# Patient Record
Sex: Male | Born: 1960 | Race: White | Hispanic: No | Smoking: Current every day smoker
Health system: Southern US, Community
[De-identification: ages and names within clinical notes are randomized; demographics above are authoritative.]

## PROBLEM LIST (undated history)

## (undated) DIAGNOSIS — K759 Inflammatory liver disease, unspecified: Secondary | ICD-10-CM

## (undated) HISTORY — PX: TONSILLECTOMY: SUR1361

---

## 2015-07-08 DIAGNOSIS — F17200 Nicotine dependence, unspecified, uncomplicated: Secondary | ICD-10-CM | POA: Insufficient documentation

## 2015-07-08 DIAGNOSIS — Z87891 Personal history of nicotine dependence: Secondary | ICD-10-CM | POA: Insufficient documentation

## 2015-07-08 DIAGNOSIS — N529 Male erectile dysfunction, unspecified: Secondary | ICD-10-CM | POA: Insufficient documentation

## 2015-07-08 DIAGNOSIS — F419 Anxiety disorder, unspecified: Secondary | ICD-10-CM | POA: Insufficient documentation

## 2015-07-08 DIAGNOSIS — F1011 Alcohol abuse, in remission: Secondary | ICD-10-CM | POA: Insufficient documentation

## 2016-12-14 DIAGNOSIS — J3089 Other allergic rhinitis: Secondary | ICD-10-CM | POA: Insufficient documentation

## 2017-02-28 DIAGNOSIS — M5116 Intervertebral disc disorders with radiculopathy, lumbar region: Secondary | ICD-10-CM | POA: Insufficient documentation

## 2017-03-26 DIAGNOSIS — M25552 Pain in left hip: Secondary | ICD-10-CM | POA: Insufficient documentation

## 2017-11-07 DIAGNOSIS — Z122 Encounter for screening for malignant neoplasm of respiratory organs: Secondary | ICD-10-CM | POA: Insufficient documentation

## 2018-03-02 HISTORY — PX: MOHS SURGERY: SUR867

## 2018-05-09 DIAGNOSIS — J432 Centrilobular emphysema: Secondary | ICD-10-CM | POA: Insufficient documentation

## 2018-08-28 ENCOUNTER — Other Ambulatory Visit: Payer: Self-pay | Admitting: Orthopaedic Surgery

## 2018-09-18 NOTE — Patient Instructions (Addendum)
DUE TO COVID-19 ONLY ONE VISITOR IS ALLOWED TO COME WITH YOU AND STAY IN THE WAITING ROOM ONLY DURING PRE OP AND PROCEDURE DAY OF SURGERY. THE 1 VISITOR MAY VISIT WITH YOU AFTER SURGERY IN YOUR PRIVATE ROOM DURING VISITING HOURS ONLY!  YOU NEED TO HAVE A COVID 19 TEST ON 09-26-2018 @ 2:45 PM, THIS TEST MUST BE DONE BEFORE SURGERY, COME  [address], [place] , [id number].  [facility] ONCE YOUR COVID TEST IS COMPLETED, PLEASE BEGIN THE QUARANTINE INSTRUCTIONS AS OUTLINED IN YOUR HANDOUT.                Jonathan Stephens    Your procedure is scheduled on: 09-30-2018   [facility]    Report to Admitting at 7:30AM     Call this number if you have problems the morning of surgery [phone]   BRUSH YOUR TEETH MORNING OF SURGERY AND RINSE YOUR MOUTH OUT, NO CHEWING GUM CANDY OR MINTS.   Do not eat food After Midnight. YOU MAY HAVE CLEAR LIQUIDS FROM MIDNIGHT UNTIL 7:00AM. At 7:00AM Please finish the prescribed Pre-Surgery ENSURE drink. Nothing by mouth after you finish the ENSURE drink !   CLEAR LIQUID DIET   Foods Allowed                                                                     Foods Excluded  Coffee and tea, regular and decaf                             liquids that you cannot  Plain Jell-O any favor except red or purple                                           see through such as: Fruit ices (not with fruit pulp)                                     milk, soups, orange juice  Iced Popsicles                                    All solid food Carbonated beverages, regular and diet                                    Cranberry, grape and apple juices Sports drinks like Gatorade Lightly seasoned clear broth or consume(fat free) Sugar, honey syrup   _____________________________________________________________________     Take these medicines the morning of surgery with A SIP OF WATER: GABAPENTIN                                You may not have any metal on your body including hair pins and  piercings     Do not wear jewelry, cologne, lotions, powders or deodorant                          Men may shave face and neck.   Do not bring valuables to the hospital. [place].  Contacts, dentures or bridgework may not be worn into surgery.   YOU MAY BRING A SMALL OVERNIGHT BAG              Please read over the following fact sheets you were given: _____________________________________________________________________             [facility] - Preparing for Surgery Before surgery, you can play an important role.  Because skin is not sterile, your skin needs to be as free of germs as possible.  You can reduce the number of germs on your skin by washing with CHG (chlorahexidine gluconate) soap before surgery.  CHG is an antiseptic cleaner which kills germs and bonds with the skin to continue killing germs even after washing. Please DO NOT use if you have an allergy to CHG or antibacterial soaps.  If your skin becomes reddened/irritated stop using the CHG and inform your nurse when you arrive at Short Stay. Do not shave (including legs and underarms) for at least 48 hours prior to the first CHG shower.  You may shave your face/neck. Please follow these instructions carefully:  1.  Shower with CHG Soap the night before surgery and the  morning of Surgery.  2.  If you choose to wash your hair, wash your hair first as usual with your  normal  shampoo.  3.  After you shampoo, rinse your hair and body thoroughly to remove the  shampoo.                           4.  Use CHG as you would any other liquid soap.  You can apply chg directly  to the skin and wash                       Gently with a scrungie or clean washcloth.  5.  Apply the CHG Soap to your body ONLY FROM THE NECK DOWN.   Do not use on face/ open                           Wound or open sores.  Avoid contact with eyes, ears mouth and genitals (private parts).                       Wash face,  Genitals (private parts) with your normal soap.             6.  Wash thoroughly, paying special attention to the area where your surgery  will be performed.  7.  Thoroughly rinse your body with warm water from the neck down.  8.  DO NOT shower/wash with your normal soap after using and rinsing off  the CHG Soap.                9.  Pat yourself dry with a clean towel.            10.  Wear clean pajamas.  11.  Place clean sheets on your bed the night of your first shower and do not  sleep with pets. Day of Surgery : Do not apply any lotions/deodorants the morning of surgery.  Please wear clean clothes to the hospital/surgery center.  FAILURE TO FOLLOW THESE INSTRUCTIONS MAY RESULT IN THE CANCELLATION OF YOUR SURGERY PATIENT SIGNATURE_________________________________  NURSE SIGNATURE__________________________________  ________________________________________________________________________   Jonathan Stephens  An incentive spirometer is a tool that can help keep your lungs clear and active. This tool measures how well you are filling your lungs with each breath. Taking long deep breaths may help reverse or decrease the chance of developing breathing (pulmonary) problems (especially infection) following:  A long period of time when you are unable to move or be active. BEFORE THE PROCEDURE   If the spirometer includes an indicator to show your best effort, your nurse or respiratory therapist will set it to a desired goal.  If possible, sit up straight or lean slightly forward. Try not to slouch.  Hold the incentive spirometer in an upright position. INSTRUCTIONS FOR USE  1. Sit on the edge of your bed if possible, or sit up as far as you can in bed or on a chair. 2. Hold the incentive spirometer in an upright position. 3. Breathe out normally. 4. Place the mouthpiece in your  mouth and seal your lips tightly around it. 5. Breathe in slowly and as deeply as possible, raising the piston or the ball toward the top of the column. 6. Hold your breath for 3-5 seconds or for as long as possible. Allow the piston or ball to fall to the bottom of the column. 7. Remove the mouthpiece from your mouth and breathe out normally. 8. Rest for a few seconds and repeat Steps 1 through 7 at least 10 times every 1-2 hours when you are awake. Take your time and take a few normal breaths between deep breaths. 9. The spirometer may include an indicator to show your best effort. Use the indicator as a goal to work toward during each repetition. 10. After each set of 10 deep breaths, practice coughing to be sure your lungs are clear. If you have an incision (the cut made at the time of surgery), support your incision when coughing by placing a pillow or rolled up towels firmly against it. Once you are able to get out of bed, walk around indoors and cough well. You may stop using the incentive spirometer when instructed by your caregiver.  RISKS AND COMPLICATIONS  Take your time so you do not get dizzy or light-headed.  If you are in pain, you may need to take or ask for pain medication before doing incentive spirometry. It is harder to take a deep breath if you are having pain. AFTER USE  Rest and breathe slowly and easily.  It can be helpful to keep track of a log of your progress. Your caregiver can provide you with a simple table to help with this. If you are using the spirometer at home, follow these instructions: Jonathan Stephens IF:   You are having difficultly using the spirometer.  You have trouble using the spirometer as often as instructed.  Your pain medication is not giving enough relief while using the spirometer.  You develop fever of 100.5 F (38.1 C) or higher. SEEK IMMEDIATE MEDICAL CARE IF:   You cough up bloody sputum that had not been present before.  You  develop fever of 102 F (38.9 C)  or greater.  You develop worsening pain at or near the incision site. MAKE SURE YOU:   Understand these instructions.  Will watch your condition.  Will get help right away if you are not doing well or get worse. Document Released: 04/30/2006 Document Revised: 03/12/2011 Document Reviewed: 07/01/2006 [facility] Patient Information 2014 [place], [place].   ________________________________________________________________________

## 2018-09-22 ENCOUNTER — Encounter (HOSPITAL_COMMUNITY)
Admission: RE | Admit: 2018-09-22 | Discharge: 2018-09-22 | Disposition: A | Discharge status: Home / Self Care | Payer: Federal, State, Local not specified - PPO | Source: Ambulatory Visit | Attending: Orthopaedic Surgery | Admitting: Orthopaedic Surgery

## 2018-09-22 ENCOUNTER — Encounter (HOSPITAL_COMMUNITY): Payer: Self-pay

## 2018-09-22 ENCOUNTER — Ambulatory Visit (HOSPITAL_COMMUNITY)
Admission: RE | Admit: 2018-09-22 | Discharge: 2018-09-22 | Disposition: A | Discharge status: Home / Self Care | Payer: Federal, State, Local not specified - PPO | Source: Ambulatory Visit | Attending: Orthopaedic Surgery | Admitting: Orthopaedic Surgery

## 2018-09-22 ENCOUNTER — Other Ambulatory Visit: Payer: Self-pay

## 2018-09-22 DIAGNOSIS — Z01818 Encounter for other preprocedural examination: Secondary | ICD-10-CM

## 2018-09-22 LAB — URINALYSIS, ROUTINE W REFLEX MICROSCOPIC
Bacteria, UA: NONE SEEN
Bilirubin Urine: NEGATIVE
Glucose, UA: NEGATIVE mg/dL
Hgb urine dipstick: NEGATIVE
Ketones, ur: NEGATIVE mg/dL
Leukocytes,Ua: NEGATIVE
Nitrite: NEGATIVE
Protein, ur: 30 mg/dL — AB
Specific Gravity, Urine: 1.02 (ref 1.005–1.030)
pH: 5 (ref 5.0–8.0)

## 2018-09-22 LAB — CBC WITH DIFFERENTIAL/PLATELET
Abs Immature Granulocytes: 0.04 10*3/uL (ref 0.00–0.07)
Basophils Absolute: 0.1 10*3/uL (ref 0.0–0.1)
Basophils Relative: 1 %
Eosinophils Absolute: 0.2 10*3/uL (ref 0.0–0.5)
Eosinophils Relative: 2 %
HCT: 38.3 % — ABNORMAL LOW (ref 39.0–52.0)
Hemoglobin: 13.3 g/dL (ref 13.0–17.0)
Immature Granulocytes: 1 %
Lymphocytes Relative: 31 %
Lymphs Abs: 2.6 10*3/uL (ref 0.7–4.0)
MCH: 29.2 pg (ref 26.0–34.0)
MCHC: 34.7 g/dL (ref 30.0–36.0)
MCV: 84.2 fL (ref 80.0–100.0)
Monocytes Absolute: 0.5 10*3/uL (ref 0.1–1.0)
Monocytes Relative: 6 %
Neutro Abs: 5 10*3/uL (ref 1.7–7.7)
Neutrophils Relative %: 59 %
Platelets: 230 10*3/uL (ref 150–400)
RBC: 4.55 MIL/uL (ref 4.22–5.81)
RDW: 12.7 % (ref 11.5–15.5)
WBC: 8.4 10*3/uL (ref 4.0–10.5)
nRBC: 0 % (ref 0.0–0.2)

## 2018-09-22 LAB — BASIC METABOLIC PANEL
Anion gap: 8 (ref 5–15)
BUN: 15 mg/dL (ref 6–20)
CO2: 23 mmol/L (ref 22–32)
Calcium: 9.5 mg/dL (ref 8.9–10.3)
Chloride: 110 mmol/L (ref 98–111)
Creatinine, Ser: 0.75 mg/dL (ref 0.61–1.24)
GFR calc Af Amer: >60 mL/min (ref >60)
GFR calc non Af Amer: >60 mL/min (ref >60)
Glucose, Bld: 101 mg/dL — ABNORMAL HIGH (ref 70–99)
Potassium: 3.9 mmol/L (ref 3.5–5.1)
Sodium: 141 mmol/L (ref 135–145)

## 2018-09-22 LAB — PROTIME-INR
INR: 1.1 (ref 0.8–1.2)
Prothrombin Time: 14 seconds (ref 11.4–15.2)

## 2018-09-22 LAB — SURGICAL PCR SCREEN
MRSA, PCR: NEGATIVE
Staphylococcus aureus: NEGATIVE

## 2018-09-22 LAB — APTT: aPTT: 31 seconds (ref 24–36)

## 2018-09-22 NOTE — Progress Notes (Signed)
PCP - Barbaraann Boys Cardiologist -   Chest x-ray - 09-22-18 EKG -  Stress Test -  ECHO -  Cardiac Cath -   Sleep Study -  CPAP -   Fasting Blood Sugar -  Checks Blood Sugar _____ times a day  Blood Thinner Instructions: Aspirin Instructions: Last Dose:  Anesthesia review:   Patient denies shortness of breath, fever, cough and chest pain at PAT appointment   Patient verbalized understanding of instructions that were given to them at the PAT appointment. Patient was also instructed that they will need to review over the PAT instructions again at home before surgery.

## 2018-09-23 LAB — ABO/RH: ABO/RH(D): O POS

## 2018-09-26 ENCOUNTER — Other Ambulatory Visit (HOSPITAL_COMMUNITY)
Admission: RE | Admit: 2018-09-26 | Discharge: 2018-09-26 | Disposition: A | Discharge status: Home / Self Care | Payer: Federal, State, Local not specified - PPO | Source: Ambulatory Visit | Attending: Orthopaedic Surgery | Admitting: Orthopaedic Surgery

## 2018-09-26 DIAGNOSIS — Z20828 Contact with and (suspected) exposure to other viral communicable diseases: Secondary | ICD-10-CM | POA: Insufficient documentation

## 2018-09-26 DIAGNOSIS — Z01812 Encounter for preprocedural laboratory examination: Secondary | ICD-10-CM | POA: Insufficient documentation

## 2018-09-27 LAB — NOVEL CORONAVIRUS, NAA (HOSP ORDER, SEND-OUT TO REF LAB; TAT 18-24 HRS): SARS-CoV-2, NAA: NOT DETECTED

## 2018-09-29 MED ORDER — BUPIVACAINE LIPOSOME 1.3 % IJ SUSP
10.0000 mL | Freq: Once | INTRAMUSCULAR | Status: DC
  Filled 2018-09-29: qty 10

## 2018-09-29 MED ORDER — TRANEXAMIC ACID 1000 MG/10ML IV SOLN
2000.0000 mg | INTRAVENOUS | Status: DC
  Filled 2018-09-29: qty 20

## 2018-09-29 NOTE — H&P (Signed)
TOTAL HIP ADMISSION H&P  Patient is admitted for left total hip arthroplasty.  Subjective:  Chief Complaint: left hip pain  HPI: Jonathan Stephens, 58 y.o. male, has a history of pain and functional disability in the left hip(s) due to arthritis and patient has failed non-surgical conservative treatments for greater than 12 weeks to include NSAID's and/or analgesics, corticosteriod injections, flexibility and strengthening excercises, use of assistive devices, weight reduction as appropriate and activity modification.  Onset of symptoms was gradual starting 5 years ago with gradually worsening course since that time.The patient noted no past surgery on the left hip(s).  Patient currently rates pain in the left hip at 10 out of 10 with activity. Patient has night pain, worsening of pain with activity and weight bearing, trendelenberg gait, pain that interfers with activities of daily living and crepitus. Patient has evidence of subchondral cysts, subchondral sclerosis, periarticular osteophytes and joint space narrowing by imaging studies. This condition presents safety issues increasing the risk of falls. There is no current active infection.  There are no active problems to display for this patient.  No past medical history on file.  Past Surgical History:  Procedure Laterality Date  . MOHS SURGERY  03/2018    Current Facility-Administered Medications  Medication Dose Route Frequency Provider Last Rate Last Dose  . [START ON 09/30/2018] bupivacaine liposome (EXPAREL) 1.3 % injection 133 mg  10 mL Other Once Marcene Corning, MD      . Melene Muller ON 09/30/2018] tranexamic acid (CYKLOKAPRON) 2,000 mg in sodium chloride 0.9 % 50 mL Topical Application  2,000 mg Topical To OR Marcene Corning, MD       Current Outpatient Medications  Medication Sig Dispense Refill Last Dose  . cyclobenzaprine (FLEXERIL) 10 MG tablet Take 10 mg by mouth 3 (three) times daily as needed for muscle spasms.     Marland Kitchen gabapentin  (NEURONTIN) 300 MG capsule Take 300 mg by mouth 3 (three) times daily.     Marland Kitchen ibuprofen (ADVIL) 800 MG tablet Take 800 mg by mouth every 8 (eight) hours as needed (pain).     . Misc Natural Products (GLUCOSAMINE CHOND COMPLEX/MSM PO) Take 1 tablet by mouth daily.     . Multiple Vitamins-Minerals (MULTIVITAMIN WITH MINERALS) tablet Take 1 tablet by mouth daily.     . Omega-3 Fatty Acids (FISH OIL) 1000 MG CAPS Take 1,000 mg by mouth daily.      No Known Allergies  Social History   Tobacco Use  . Smoking status: Current Some Day Smoker    Types: Cigarettes  . Smokeless tobacco: Never Used  . Tobacco comment: 4-5 cigarettes a week  Substance Use Topics  . Alcohol use: Not Currently    No family history on file.   Review of Systems  Musculoskeletal: Positive for joint pain.       Left hip  All other systems reviewed and are negative.   Objective:  Physical Exam  Constitutional: He is oriented to person, place, and time. He appears well-developed and well-nourished.  HENT:  Head: Normocephalic and atraumatic.  Eyes: Pupils are equal, round, and reactive to light.  Neck: Normal range of motion.  Cardiovascular: Normal rate and regular rhythm.  Respiratory: Effort normal.  GI: Soft.  Musculoskeletal:     Comments: Left hip motion is limited and extremely painful in internal rotation.  His leg lengths look grossly equal.  He walks with a markedly altered gait.  Lumbosacral motion is a little limited.  He has no  scars about his back.  Abdominal exam is benign.  Sensation and motor function are intact in his feet with palpable pulses on both sides.    Neurological: He is alert and oriented to person, place, and time.  Skin: Skin is warm and dry.  Psychiatric: He has a normal mood and affect. His behavior is normal. Judgment and thought content normal.    Vital signs in last 24 hours:    Labs:   Estimated body mass index is 28.76 kg/m as calculated from the following:   Height  as of 09/22/18: 6\' 2"  (1.88 m).   Weight as of 09/22/18: 101.6 kg.   Imaging Review Plain radiographs demonstrate severe degenerative joint disease of the left hip(s). The bone quality appears to be good for age and reported activity level.      Assessment/Plan:  End stage primary arthritis, left hip(s)  The patient history, physical examination, clinical judgement of the provider and imaging studies are consistent with end stage degenerative joint disease of the left hip(s) and total hip arthroplasty is deemed medically necessary. The treatment options including medical management, injection therapy, arthroscopy and arthroplasty were discussed at length. The risks and benefits of total hip arthroplasty were presented and reviewed. The risks due to aseptic loosening, infection, stiffness, dislocation/subluxation,  thromboembolic complications and other imponderables were discussed.  The patient acknowledged the explanation, agreed to proceed with the plan and consent was signed. Patient is being admitted for inpatient treatment for surgery, pain control, PT, OT, prophylactic antibiotics, VTE prophylaxis, progressive ambulation and ADL's and discharge planning.The patient is planning to be discharged home with home health services

## 2018-09-30 ENCOUNTER — Ambulatory Visit (HOSPITAL_COMMUNITY): Payer: Federal, State, Local not specified - PPO | Admitting: Physician Assistant

## 2018-09-30 ENCOUNTER — Observation Stay (HOSPITAL_COMMUNITY)
Admission: RE | Admit: 2018-09-30 | Discharge: 2018-10-01 | Disposition: A | Discharge status: Home / Self Care | Payer: Federal, State, Local not specified - PPO | Attending: Orthopaedic Surgery | Admitting: Orthopaedic Surgery

## 2018-09-30 ENCOUNTER — Encounter (HOSPITAL_COMMUNITY): Payer: Self-pay | Admitting: *Deleted

## 2018-09-30 ENCOUNTER — Ambulatory Visit (HOSPITAL_COMMUNITY): Payer: Federal, State, Local not specified - PPO

## 2018-09-30 ENCOUNTER — Encounter (HOSPITAL_COMMUNITY)
Admission: RE | Disposition: A | Discharge status: Home / Self Care | Payer: Self-pay | Source: Home / Self Care | Attending: Orthopaedic Surgery

## 2018-09-30 ENCOUNTER — Other Ambulatory Visit: Payer: Self-pay

## 2018-09-30 ENCOUNTER — Ambulatory Visit (HOSPITAL_COMMUNITY): Payer: Federal, State, Local not specified - PPO | Admitting: Certified Registered Nurse Anesthetist

## 2018-09-30 DIAGNOSIS — M1612 Unilateral primary osteoarthritis, left hip: Secondary | ICD-10-CM | POA: Diagnosis not present

## 2018-09-30 DIAGNOSIS — Z79899 Other long term (current) drug therapy: Secondary | ICD-10-CM | POA: Diagnosis not present

## 2018-09-30 DIAGNOSIS — Z419 Encounter for procedure for purposes other than remedying health state, unspecified: Secondary | ICD-10-CM

## 2018-09-30 DIAGNOSIS — F17210 Nicotine dependence, cigarettes, uncomplicated: Secondary | ICD-10-CM | POA: Insufficient documentation

## 2018-09-30 HISTORY — PX: TOTAL HIP ARTHROPLASTY: SHX124

## 2018-09-30 LAB — TYPE AND SCREEN
ABO/RH(D): O POS
Antibody Screen: NEGATIVE

## 2018-09-30 SURGERY — ARTHROPLASTY, HIP, TOTAL, ANTERIOR APPROACH
Anesthesia: Spinal | Site: Hip | Laterality: Left

## 2018-09-30 MED ORDER — MIDAZOLAM HCL 2 MG/2ML IJ SOLN
INTRAMUSCULAR | Status: AC
  Filled 2018-09-30: qty 2

## 2018-09-30 MED ORDER — PROPOFOL 10 MG/ML IV BOLUS
INTRAVENOUS | Status: AC
  Filled 2018-09-30: qty 60

## 2018-09-30 MED ORDER — FENTANYL CITRATE (PF) 100 MCG/2ML IJ SOLN
INTRAMUSCULAR | Status: DC | PRN
  Administered 2018-09-30 (×2): 50 ug via INTRAVENOUS

## 2018-09-30 MED ORDER — GABAPENTIN 300 MG PO CAPS
300.0000 mg | ORAL_CAPSULE | Freq: Three times a day (TID) | ORAL | Status: DC
  Administered 2018-09-30 – 2018-10-01 (×3): 300 mg via ORAL
  Filled 2018-09-30 (×3): qty 1

## 2018-09-30 MED ORDER — MEPERIDINE HCL 50 MG/ML IJ SOLN: 6.2500 mg | INTRAMUSCULAR | Status: DC | PRN

## 2018-09-30 MED ORDER — ALUM & MAG HYDROXIDE-SIMETH 200-200-20 MG/5ML PO SUSP: 30.0000 mL | ORAL | Status: DC | PRN

## 2018-09-30 MED ORDER — MENTHOL 3 MG MT LOZG: 1.0000 | LOZENGE | OROMUCOSAL | Status: DC | PRN

## 2018-09-30 MED ORDER — 0.9 % SODIUM CHLORIDE (POUR BTL) OPTIME
TOPICAL | Status: DC | PRN
  Administered 2018-09-30: 1000 mL

## 2018-09-30 MED ORDER — BISACODYL 5 MG PO TBEC: 5.0000 mg | DELAYED_RELEASE_TABLET | Freq: Every day | ORAL | Status: DC | PRN

## 2018-09-30 MED ORDER — PROPOFOL 10 MG/ML IV BOLUS
INTRAVENOUS | Status: AC
  Filled 2018-09-30: qty 20

## 2018-09-30 MED ORDER — METHOCARBAMOL 500 MG IVPB - SIMPLE MED
500.0000 mg | Freq: Four times a day (QID) | INTRAVENOUS | Status: DC | PRN
  Filled 2018-09-30: qty 50

## 2018-09-30 MED ORDER — PROPOFOL 10 MG/ML IV BOLUS
INTRAVENOUS | Status: DC | PRN
  Administered 2018-09-30: 30 mg via INTRAVENOUS

## 2018-09-30 MED ORDER — ACETAMINOPHEN 500 MG PO TABS
500.0000 mg | ORAL_TABLET | Freq: Four times a day (QID) | ORAL | Status: DC
  Filled 2018-09-30: qty 1

## 2018-09-30 MED ORDER — ONDANSETRON HCL 4 MG PO TABS: 4.0000 mg | ORAL_TABLET | Freq: Four times a day (QID) | ORAL | Status: DC | PRN

## 2018-09-30 MED ORDER — HYDROCODONE-ACETAMINOPHEN 7.5-325 MG PO TABS
1.0000 | ORAL_TABLET | ORAL | Status: DC | PRN
  Administered 2018-09-30: 2 via ORAL
  Filled 2018-09-30: qty 2

## 2018-09-30 MED ORDER — BUPIVACAINE HCL 0.25 % IJ SOLN
INTRAMUSCULAR | Status: DC | PRN
  Administered 2018-09-30: 30 mL

## 2018-09-30 MED ORDER — TRANEXAMIC ACID 1000 MG/10ML IV SOLN
INTRAVENOUS | Status: DC | PRN
  Administered 2018-09-30: 2000 mg via TOPICAL

## 2018-09-30 MED ORDER — LACTATED RINGERS IV SOLN
INTRAVENOUS | Status: DC
  Administered 2018-09-30: 09:00:00 via INTRAVENOUS

## 2018-09-30 MED ORDER — FENTANYL CITRATE (PF) 100 MCG/2ML IJ SOLN
INTRAMUSCULAR | Status: AC
  Filled 2018-09-30: qty 2

## 2018-09-30 MED ORDER — TRANEXAMIC ACID-NACL 1000-0.7 MG/100ML-% IV SOLN
1000.0000 mg | Freq: Once | INTRAVENOUS | Status: AC
  Administered 2018-09-30: 1000 mg via INTRAVENOUS
  Filled 2018-09-30: qty 100

## 2018-09-30 MED ORDER — MORPHINE SULFATE (PF) 2 MG/ML IV SOLN: 0.5000 mg | INTRAVENOUS | Status: DC | PRN

## 2018-09-30 MED ORDER — CEFAZOLIN SODIUM-DEXTROSE 2-4 GM/100ML-% IV SOLN
2.0000 g | Freq: Four times a day (QID) | INTRAVENOUS | Status: AC
  Administered 2018-09-30 (×2): 2 g via INTRAVENOUS
  Filled 2018-09-30 (×2): qty 100

## 2018-09-30 MED ORDER — PHENOL 1.4 % MT LIQD
1.0000 | OROMUCOSAL | Status: DC | PRN
  Filled 2018-09-30: qty 177

## 2018-09-30 MED ORDER — LACTATED RINGERS IV SOLN: INTRAVENOUS | Status: DC

## 2018-09-30 MED ORDER — ACETAMINOPHEN 325 MG PO TABS: 325.0000 mg | ORAL_TABLET | Freq: Four times a day (QID) | ORAL | Status: DC | PRN

## 2018-09-30 MED ORDER — BUPIVACAINE IN DEXTROSE 0.75-8.25 % IT SOLN
INTRATHECAL | Status: DC | PRN
  Administered 2018-09-30: 1.8 mL via INTRATHECAL

## 2018-09-30 MED ORDER — TRANEXAMIC ACID-NACL 1000-0.7 MG/100ML-% IV SOLN
1000.0000 mg | INTRAVENOUS | Status: AC
  Administered 2018-09-30: 1000 mg via INTRAVENOUS
  Filled 2018-09-30: qty 100

## 2018-09-30 MED ORDER — MIDAZOLAM HCL 5 MG/5ML IJ SOLN
INTRAMUSCULAR | Status: DC | PRN
  Administered 2018-09-30: 2 mg via INTRAVENOUS

## 2018-09-30 MED ORDER — KETOROLAC TROMETHAMINE 30 MG/ML IJ SOLN: 30.0000 mg | Freq: Once | INTRAMUSCULAR | Status: DC | PRN

## 2018-09-30 MED ORDER — METOCLOPRAMIDE HCL 5 MG/ML IJ SOLN: 5.0000 mg | Freq: Three times a day (TID) | INTRAMUSCULAR | Status: DC | PRN

## 2018-09-30 MED ORDER — DIPHENHYDRAMINE HCL 12.5 MG/5ML PO ELIX: 12.5000 mg | ORAL_SOLUTION | ORAL | Status: DC | PRN

## 2018-09-30 MED ORDER — CEFAZOLIN SODIUM-DEXTROSE 2-4 GM/100ML-% IV SOLN
2.0000 g | INTRAVENOUS | Status: AC
  Administered 2018-09-30: 2 g via INTRAVENOUS
  Filled 2018-09-30: qty 100

## 2018-09-30 MED ORDER — ONDANSETRON HCL 4 MG/2ML IJ SOLN: 4.0000 mg | Freq: Four times a day (QID) | INTRAMUSCULAR | Status: DC | PRN

## 2018-09-30 MED ORDER — HYDROMORPHONE HCL 1 MG/ML IJ SOLN: 0.2500 mg | INTRAMUSCULAR | Status: DC | PRN

## 2018-09-30 MED ORDER — BUPIVACAINE HCL (PF) 0.25 % IJ SOLN
INTRAMUSCULAR | Status: AC
  Filled 2018-09-30: qty 30

## 2018-09-30 MED ORDER — EPHEDRINE 5 MG/ML INJ
INTRAVENOUS | Status: AC
  Filled 2018-09-30: qty 10

## 2018-09-30 MED ORDER — ONDANSETRON HCL 4 MG/2ML IJ SOLN
INTRAMUSCULAR | Status: DC | PRN
  Administered 2018-09-30: 4 mg via INTRAVENOUS

## 2018-09-30 MED ORDER — POVIDONE-IODINE 10 % EX SWAB
2.0000 "application " | Freq: Once | CUTANEOUS | Status: AC
  Administered 2018-09-30: 2 via TOPICAL

## 2018-09-30 MED ORDER — ASPIRIN 81 MG PO CHEW
81.0000 mg | CHEWABLE_TABLET | Freq: Two times a day (BID) | ORAL | Status: DC
  Administered 2018-10-01: 81 mg via ORAL
  Filled 2018-09-30: qty 1

## 2018-09-30 MED ORDER — ONDANSETRON HCL 4 MG/2ML IJ SOLN
INTRAMUSCULAR | Status: AC
  Filled 2018-09-30: qty 2

## 2018-09-30 MED ORDER — KETOROLAC TROMETHAMINE 15 MG/ML IJ SOLN
15.0000 mg | Freq: Four times a day (QID) | INTRAMUSCULAR | Status: DC
  Administered 2018-09-30 – 2018-10-01 (×3): 15 mg via INTRAVENOUS
  Filled 2018-09-30 (×3): qty 1

## 2018-09-30 MED ORDER — SODIUM CHLORIDE 0.9 % IV SOLN
INTRAVENOUS | Status: DC | PRN
  Administered 2018-09-30: 35 ug/min via INTRAVENOUS

## 2018-09-30 MED ORDER — CYCLOBENZAPRINE HCL 10 MG PO TABS: 10.0000 mg | ORAL_TABLET | Freq: Three times a day (TID) | ORAL | Status: DC | PRN

## 2018-09-30 MED ORDER — PROPOFOL 500 MG/50ML IV EMUL
INTRAVENOUS | Status: DC | PRN
  Administered 2018-09-30: 75 ug/kg/min via INTRAVENOUS

## 2018-09-30 MED ORDER — PROMETHAZINE HCL 25 MG/ML IJ SOLN: 6.2500 mg | INTRAMUSCULAR | Status: DC | PRN

## 2018-09-30 MED ORDER — DOCUSATE SODIUM 100 MG PO CAPS
100.0000 mg | ORAL_CAPSULE | Freq: Two times a day (BID) | ORAL | Status: DC
  Administered 2018-09-30 – 2018-10-01 (×2): 100 mg via ORAL
  Filled 2018-09-30 (×2): qty 1

## 2018-09-30 MED ORDER — HYDROCODONE-ACETAMINOPHEN 5-325 MG PO TABS
1.0000 | ORAL_TABLET | ORAL | Status: DC | PRN
  Administered 2018-09-30 – 2018-10-01 (×4): 2 via ORAL
  Filled 2018-09-30 (×4): qty 2

## 2018-09-30 MED ORDER — CHLORHEXIDINE GLUCONATE 4 % EX LIQD: 60.0000 mL | Freq: Once | CUTANEOUS | Status: DC

## 2018-09-30 MED ORDER — METHOCARBAMOL 500 MG PO TABS
500.0000 mg | ORAL_TABLET | Freq: Four times a day (QID) | ORAL | Status: DC | PRN
  Administered 2018-09-30: 500 mg via ORAL
  Filled 2018-09-30: qty 1

## 2018-09-30 MED ORDER — METOCLOPRAMIDE HCL 5 MG PO TABS: 5.0000 mg | ORAL_TABLET | Freq: Three times a day (TID) | ORAL | Status: DC | PRN

## 2018-09-30 MED ORDER — BUPIVACAINE LIPOSOME 1.3 % IJ SUSP
INTRAMUSCULAR | Status: DC | PRN
  Administered 2018-09-30: 10 mL

## 2018-09-30 MED ORDER — PHENYLEPHRINE 40 MCG/ML (10ML) SYRINGE FOR IV PUSH (FOR BLOOD PRESSURE SUPPORT)
PREFILLED_SYRINGE | INTRAVENOUS | Status: DC | PRN
  Administered 2018-09-30: 40 ug via INTRAVENOUS
  Administered 2018-09-30 (×2): 80 ug via INTRAVENOUS

## 2018-09-30 SURGICAL SUPPLY — 43 items
BAG DECANTER FOR FLEXI CONT (MISCELLANEOUS) ×3 IMPLANT
BLADE SAW SGTL 18X1.27X75 (BLADE) ×2 IMPLANT
BLADE SAW SGTL 18X1.27X75MM (BLADE) ×1
BOOTIES KNEE HIGH SLOAN (MISCELLANEOUS) ×3 IMPLANT
CELLS DAT CNTRL 66122 CELL SVR (MISCELLANEOUS) ×1 IMPLANT
COVER PERINEAL POST (MISCELLANEOUS) ×3 IMPLANT
COVER SURGICAL LIGHT HANDLE (MISCELLANEOUS) ×3 IMPLANT
COVER WAND RF STERILE (DRAPES) IMPLANT
CUP PINN GRIPTON 54 100 (Cup) ×3 IMPLANT
DECANTER SPIKE VIAL GLASS SM (MISCELLANEOUS) ×3 IMPLANT
DRAPE IMP U-DRAPE 54X76 (DRAPES) ×3 IMPLANT
DRAPE STERI IOBAN 125X83 (DRAPES) ×3 IMPLANT
DRAPE U-SHAPE 47X51 STRL (DRAPES) ×6 IMPLANT
DRSG AQUACEL AG ADV 3.5X 6 (GAUZE/BANDAGES/DRESSINGS) ×3 IMPLANT
DURAPREP 26ML APPLICATOR (WOUND CARE) ×3 IMPLANT
ELECT BLADE TIP CTD 4 INCH (ELECTRODE) ×3 IMPLANT
ELECT REM PT RETURN 15FT ADLT (MISCELLANEOUS) ×3 IMPLANT
ELIMINATOR HOLE APEX DEPUY (Hips) ×3 IMPLANT
GLOVE BIO SURGEON STRL SZ8 (GLOVE) ×6 IMPLANT
GLOVE BIOGEL PI IND STRL 8 (GLOVE) ×2 IMPLANT
GLOVE BIOGEL PI INDICATOR 8 (GLOVE) ×4
GOWN STRL REUS W/TWL XL LVL3 (GOWN DISPOSABLE) ×6 IMPLANT
HEAD CERAMIC 36 PLUS 8.5 12 14 (Hips) ×3 IMPLANT
HOLDER FOLEY CATH W/STRAP (MISCELLANEOUS) ×3 IMPLANT
KIT TURNOVER KIT A (KITS) IMPLANT
LINER NEUTRAL 54X36MM PLUS 4 (Hips) ×3 IMPLANT
MANIFOLD NEPTUNE II (INSTRUMENTS) ×3 IMPLANT
NEEDLE HYPO 22GX1.5 SAFETY (NEEDLE) ×3 IMPLANT
NS IRRIG 1000ML POUR BTL (IV SOLUTION) ×3 IMPLANT
PACK ANTERIOR HIP CUSTOM (KITS) ×3 IMPLANT
PROTECTOR NERVE ULNAR (MISCELLANEOUS) ×3 IMPLANT
RTRCTR WOUND ALEXIS 18CM MED (MISCELLANEOUS) ×3
STEM FEMORAL SZ 5MM STD ACTIS (Stem) ×3 IMPLANT
SUT ETHIBOND NAB CT1 #1 30IN (SUTURE) ×6 IMPLANT
SUT VIC AB 1 CT1 36 (SUTURE) ×3 IMPLANT
SUT VIC AB 2-0 CT1 27 (SUTURE) ×2
SUT VIC AB 2-0 CT1 TAPERPNT 27 (SUTURE) ×1 IMPLANT
SUT VIC AB 3-0 PS2 18 (SUTURE) ×2
SUT VIC AB 3-0 PS2 18XBRD (SUTURE) ×1 IMPLANT
SUT VLOC 180 0 24IN GS25 (SUTURE) ×3 IMPLANT
SYR 50ML LL SCALE MARK (SYRINGE) ×3 IMPLANT
TRAY FOLEY MTR SLVR 16FR STAT (SET/KITS/TRAYS/PACK) ×3 IMPLANT
YANKAUER SUCT BULB TIP 10FT TU (MISCELLANEOUS) ×3 IMPLANT

## 2018-09-30 NOTE — Evaluation (Signed)
Physical Therapy Evaluation Patient Details Name: Jonathan Stephens MRN: [id number] DOB: 09-Apr-1960 Today's Date: 09/30/2018   History of Present Illness  Patient is 58 y.o male s/p Lt THA on 09/30/18.  Clinical Impression  Jonathan Stephens is a 58 y.o. male POD 0 s/p Lt THA ant approach. Patient reports independence with mobility at baseline. Patient is now limited by functional impairments (see PT problem list below) and requires up to min guard for transfers and gait with RW as well as stair mobility. Patient was able to ambulate ~90 feet with RW and minimal cues for safety. He initiated stair training as he was hoping to be discharged tonight and performed then with min cues and min guard for safety. He was shaking thorughout session and reported feeling cold, he denied any dizziness, nausea, SOB, or light headedness throughout session. Patient instructed in exercise to facilitate ROM and circulation to manage edema. He was educated that discharge home tonight would not be appropriate as his pain was increasing and he was likely still having symptoms related to anaesthesia and spinal block wearing off. Patient will also need a RW to be able to go home safely. He will benefit from continued skilled PT interventions to address impairments and progress towards PLOF. Acute PT will follow to progress mobility and stair training in preparation for safe discharge home.     Follow Up Recommendations Follow surgeon's recommendation for DC plan and follow-up therapies    Equipment Recommendations  Rolling walker with 5" wheels    Recommendations for Other Services       Precautions / Restrictions Precautions Precautions: Fall Restrictions Weight Bearing Restrictions: No      Mobility  Bed Mobility Overal bed mobility: Needs Assistance Bed Mobility: Supine to Sit     Supine to sit: HOB elevated;Supervision     General bed mobility comments: pt using bed rails and HOB elevated, no cues  required for sequencing  Transfers Overall transfer level: Needs assistance Equipment used: Rolling walker (2 wheeled) Transfers: Sit to/from Stand Sit to Stand: Supervision         General transfer comment: verbal cues for technique and hand placement and technique to push up from bed  Ambulation/Gait Ambulation/Gait assistance: Min guard Gait Distance (Feet): 90 Feet Assistive device: Rolling walker (2 wheeled) Gait Pattern/deviations: Step-through pattern;Decreased step length - right;Decreased step length - left;Decreased stride length;Decreased weight shift to left Gait velocity: decreased   General Gait Details: pt requires cues for safe hand placement and proximity to RW initially and he maintained safe use of RW throughout  Stairs Stairs: Yes Stairs assistance: Min guard Stair Management: Two rails;Forwards Number of Stairs: 5 General stair comments: verbal cues for safe technique to ascend stairs leading with Rt LE and descend leading with Lt, no overt LOB throughout  Wheelchair Mobility    Modified Rankin (Stroke Patients Only)       Balance Overall balance assessment: Needs assistance Sitting-balance support: Feet supported;No upper extremity supported Sitting balance-Leahy Scale: Good     Standing balance support: During functional activity;Bilateral upper extremity supported Standing balance-Leahy Scale: Fair Standing balance comment: pt requires support for dynamic activities and gait                Pertinent Vitals/Pain Pain Assessment: 0-10 Pain Score: 7  Pain Location: Lt hip Pain Descriptors / Indicators: Aching;Burning;Discomfort Pain Intervention(s): Limited activity within patient's tolerance;Monitored during session;Repositioned    Home Living Family/patient expects to be discharged to:: Private residence Living Arrangements:  Spouse/significant other Available Help at Discharge: Family Type of Home: House Home Access: Stairs to  enter Entrance Stairs-Rails: Right;Left;Can reach both Technical brewer of Steps: 4 Home Layout: One level Home Equipment: None      Prior Function Level of Independence: Independent         Comments: pt was mobilizing independently prior to surgery, he is still working as am Financial planner        Extremity/Trunk Assessment   Upper Extremity Assessment Upper Extremity Assessment: Overall WFL for tasks assessed    Lower Extremity Assessment Lower Extremity Assessment: LLE deficits/detail LLE Deficits / Details: pt with sensation intact for light touch, good quad set in supine and demonstrated good coordination with stepping in WB LLE Sensation: WNL LLE Coordination: WNL    Cervical / Trunk Assessment Cervical / Trunk Assessment: Normal  Communication   Communication: No difficulties  Cognition Arousal/Alertness: Awake/alert Behavior During Therapy: WFL for tasks assessed/performed Overall Cognitive Status: Within Functional Limits for tasks assessed                 General Comments General comments (skin integrity, edema, etc.): pt shaking throughout session, reported he was cold and reporting pain on his butt cheeks, BP at EOS was 116/65 with HR of 86 bpm    Exercises Total Joint Exercises Ankle Circles/Pumps: AROM;Seated;10 reps;Both Quad Sets: AROM;Supine;Seated;Left;10 reps(2 sets (1x5 supine, 1x5 seated)) Long Arc Quad: AROM;5 reps;Left;Seated   Assessment/Plan    PT Assessment Patient needs continued PT services  PT Problem List Decreased strength;Decreased balance;Decreased range of motion;Decreased mobility;Decreased activity tolerance;Decreased knowledge of use of DME       PT Treatment Interventions DME instruction;Functional mobility training;Balance training;Patient/family education;Modalities;Gait training;Therapeutic activities;Therapeutic exercise;Stair training    PT Goals (Current goals can be found in the  Care Plan section)  Acute Rehab PT Goals Patient Stated Goal: return to independent level of function and go home PT Goal Formulation: With patient Time For Goal Achievement: 10/07/18 Potential to Achieve Goals: Good    Frequency 7X/week    AM-PAC PT "6 Clicks" Mobility  Outcome Measure Help needed turning from your back to your side while in a flat bed without using bedrails?: A Little Help needed moving from lying on your back to sitting on the side of a flat bed without using bedrails?: A Little Help needed moving to and from a bed to a chair (including a wheelchair)?: A Little Help needed standing up from a chair using your arms (e.g., wheelchair or bedside chair)?: A Little Help needed to walk in hospital room?: A Little Help needed climbing 3-5 steps with a railing? : A Little 6 Click Score: 18    End of Session Equipment Utilized During Treatment: Gait belt Activity Tolerance: Patient tolerated treatment well Patient left: in chair;with call bell/phone within reach;with chair alarm set;with family/visitor present Nurse Communication: Mobility status PT Visit Diagnosis: Unsteadiness on feet (R26.81);Other abnormalities of gait and mobility (R26.89);Muscle weakness (generalized) (M62.81);Difficulty in walking, not elsewhere classified (R26.2)    Time: 1655-3748 PT Time Calculation (min) (ACUTE ONLY): 19 min   Charges:   PT Evaluation $PT Eval Low Complexity: 1 Low         Kipp Brood, PT, DPT, [facility] Physical Therapist with [facility]  09/30/2018 5:25 PM

## 2018-09-30 NOTE — Op Note (Signed)
PRE-OP DIAGNOSIS:  LEFT HIP DEGENERATIVE JOINT DISEASE POST-OP DIAGNOSIS: same PROCEDURE:  LEFT TOTAL HIP ARTHROPLASTY ANTERIOR APPROACH ANESTHESIA:  Spinal and MAC SURGEON:  Melrose Nakayama MD ASSISTANT:  Loni Dolly PA-C   INDICATIONS FOR PROCEDURE:  The patient is a 58 y.o. male with a long history of a painful hip.  This has persisted despite multiple conservative measures.  The patient has persisted with pain and dysfunction making rest and activity difficult.  A total hip replacement is offered as surgical treatment.  Informed operative consent was obtained after discussion of possible complications including reaction to anesthesia, infection, neurovascular injury, dislocation, DVT, PE, and death.  The importance of the postoperative rehab program to optimize result was stressed with the patient.  SUMMARY OF FINDINGS AND PROCEDURE:  Under the above anesthesia through a anterior approach an the Hana table a left THR was performed.  The patient had severe degenerative change and excellent bone quality.  We used DePuy components to replace the hip and these were size 5 standard Actis femur capped with a +8.5 30m metal hip ball.  On the acetabular side we used a size 54 Gription shell with a plus 4 neutral polyethylene liner.  We did use a hole eliminator.  ALoni DollyPA-C assisted throughout and was invaluable to the completion of the case in that he helped position and retract while I performed the procedure.  He also closed simultaneously to help minimize OR time.  I used fluoroscopy throughout the case to check position of components and leg lengths and read all these views myself.  DESCRIPTION OF PROCEDURE:  The patient was taken to the OR suite where the above anesthetic was applied.  The patient was then positioned on the Hana table supine.  All bony prominences were appropriately padded.  Prep and drape was then performed in normal sterile fashion.  The patient was given kefzol preoperative  antibiotic and an appropriate time out was performed.  We then took an anterior approach to the left hip.  Dissection was taken through adipose to the tensor fascia lata fascia.  This structure was incised longitudinally and we dissected in the intermuscular interval just medial to this muscle.  Cobra retractors were placed superior and inferior to the femoral neck superficial to the capsule.  A capsular incision was then made and the retractors were placed along the femoral neck.  Xray was brought in to get a good level for the femoral neck cut which was made with an oscillating saw and osteotome.  The femoral head was removed with a corkscrew.  The acetabulum was exposed and some labral tissues were excised. Reaming was taken to the inside wall of the pelvis and sequentially up to 1 mm smaller than the actual component.  A trial of components was done and then the aforementioned acetabular shell was placed in appropriate tilt and anteversion confirmed by fluoroscopy. The liner was placed along with the hole eliminator and attention was turned to the femur.  The leg was brought down and over into adduction and the elevator bar was used to raise the femur up gently in the wound.  The piriformis was released with care taken to preserve the obturator internus attachment and all of the posterior capsule. The femur was reamed and then broached to the appropriate size.  A trial reduction was done and the aforementioned head and neck assembly gave uKoreathe best stability in extension with external rotation.  Leg lengths were felt to be about equal by  fluoroscopic exam.  The trial components were removed and the wound irrigated.  We then placed the femoral component in appropriate anteversion.  The head was applied to a dry stem neck and the hip again reduced.  It was again stable in the aforementioned position.  The would was irrigated again followed by re-approximation of anterior capsule with ethibond suture. Tensor  fascia was repaired with V-loc suture  followed by deep closure with #O and #2 undyed vicryl.  Skin was closed with subQ stitch and steristrips followed by a sterile dressing.  EBL and IOF can be obtained from anesthesia records.  DISPOSITION:  The patient was extubated in the OR and taken to PACU in stable condition to be admitted to the Orthopedic Surgery for appropriate post-op care to include perioperative antibiotics and DVT prophylaxis.

## 2018-09-30 NOTE — Transfer of Care (Signed)
Immediate Anesthesia Transfer of Care Note  Patient: Jonathan Stephens  Procedure(s) Performed: LEFT TOTAL HIP ARTHROPLASTY ANTERIOR APPROACH (Left Hip)  Patient Location: PACU  Anesthesia Type:Spinal  Level of Consciousness: awake, alert  and oriented  Airway & Oxygen Therapy: Patient Spontanous Breathing and Patient connected to face mask oxygen  Post-op Assessment: Report given to RN and Post -op Vital signs reviewed and stable  Post vital signs: Reviewed and stable  Last Vitals:  Vitals Value Taken Time  BP 115/76 09/30/18 1147  Temp    Pulse 72 09/30/18 1147  Resp 10 09/30/18 1147  SpO2 100 % 09/30/18 1147  Vitals shown include unvalidated device data.  Last Pain:  Vitals:   09/30/18 0857  TempSrc:   PainSc: 0-No pain      Patients Stated Pain Goal: 3 (45/80/99 8338)  Complications: No apparent anesthesia complications

## 2018-09-30 NOTE — Anesthesia Procedure Notes (Signed)
Spinal  Patient location during procedure: OR End time: 09/30/2018 10:05 AM Staffing Anesthesiologist: Lyn Hollingshead, MD Resident/CRNA: Maxwell Caul, CRNA Performed: resident/CRNA  Preanesthetic Checklist Completed: patient identified, site marked, surgical consent, pre-op evaluation, timeout performed, IV checked, risks and benefits discussed and monitors and equipment checked Spinal Block Patient position: sitting Prep: DuraPrep Patient monitoring: heart rate, cardiac monitor, continuous pulse ox and blood pressure Approach: midline Location: L3-4 Injection technique: single-shot Needle Needle type: Pencan  Needle gauge: 24 G Needle length: 10 cm Assessment Sensory level: T4 Additional Notes IV functioning, monitors applied to pt. Expiration date of kit checked and confirmed to be in date. Sterile prep and drape, hand hygiene and sterile gloves used. Pt was positioned and spine was prepped in sterile fashion. Skin was anesthetized with lidocaine. Free flow of clear CSF obtained prior to injecting local anesthetic into CSF x 1 attempt. Spinal needle aspirated freely following injection. Needle was carefully withdrawn, and pt tolerated procedure well. Loss of motor and sensory on exam post injection. Dr Jillyn Hidden at bedside during entire placement.

## 2018-09-30 NOTE — Plan of Care (Signed)
Plan of care for post op day 0 discussed with patient. Patient is interested in discharging today.   Will call MD about patient requests.  . . . MD made aware and stated he would round on patient this afternoon

## 2018-09-30 NOTE — Anesthesia Procedure Notes (Signed)
Procedure Name: MAC Date/Time: 09/30/2018 9:59 AM Performed by: Maxwell Caul, CRNA Pre-anesthesia Checklist: Patient identified, Emergency Drugs available, Suction available and Patient being monitored Oxygen Delivery Method: Simple face mask

## 2018-09-30 NOTE — Interval H&P Note (Signed)
History and Physical Interval Note:  09/30/2018 9:10 AM  Jonathan Stephens  has presented today for surgery, with the diagnosis of LEFT HIP DEGENERATIVE JOINT DISEASE.  The various methods of treatment have been discussed with the patient and family. After consideration of risks, benefits and other options for treatment, the patient has consented to  Procedure(s): LEFT TOTAL HIP ARTHROPLASTY ANTERIOR APPROACH (Left) as a surgical intervention.  The patient's history has been reviewed, patient examined, no change in status, stable for surgery.  I have reviewed the patient's chart and labs.  Questions were answered to the patient's satisfaction.     Hessie Dibble

## 2018-09-30 NOTE — Anesthesia Preprocedure Evaluation (Signed)
Anesthesia Evaluation  Patient identified by MRN, date of birth, ID band Patient awake    Reviewed: Allergy & Precautions, NPO status , Patient's Chart, lab work & pertinent test results  Airway Mallampati: I       Dental no notable dental hx. (+) Teeth Intact   Pulmonary Current Smoker and Patient abstained from smoking.,    Pulmonary exam normal breath sounds clear to auscultation       Cardiovascular negative cardio ROS Normal cardiovascular exam Rhythm:Regular Rate:Normal     Neuro/Psych negative neurological ROS  negative psych ROS   GI/Hepatic negative GI ROS, Neg liver ROS,   Endo/Other  negative endocrine ROS  Renal/GU negative Renal ROS  negative genitourinary   Musculoskeletal negative musculoskeletal ROS (+)   Abdominal Normal abdominal exam  (+)   Peds  Hematology negative hematology ROS (+)   Anesthesia Other Findings   Reproductive/Obstetrics                             Anesthesia Physical Anesthesia Plan  ASA: II  Anesthesia Plan: Spinal   Post-op Pain Management:    Induction:   PONV Risk Score and Plan: 1 and Ondansetron  Airway Management Planned: Natural Airway, Nasal Cannula and Simple Face Mask  Additional Equipment: None  Intra-op Plan:   Post-operative Plan:   Informed Consent: I have reviewed the patients History and Physical, chart, labs and discussed the procedure including the risks, benefits and alternatives for the proposed anesthesia with the patient or authorized representative who has indicated his/her understanding and acceptance.       Plan Discussed with: CRNA  Anesthesia Plan Comments:         Anesthesia Quick Evaluation

## 2018-10-01 ENCOUNTER — Encounter (HOSPITAL_COMMUNITY): Payer: Self-pay | Admitting: Orthopaedic Surgery

## 2018-10-01 DIAGNOSIS — M1612 Unilateral primary osteoarthritis, left hip: Secondary | ICD-10-CM | POA: Diagnosis not present

## 2018-10-01 MED ORDER — TIZANIDINE HCL 4 MG PO TABS: 4.0000 mg | ORAL_TABLET | Freq: Four times a day (QID) | ORAL | 1 refills | Status: DC | PRN

## 2018-10-01 MED ORDER — TRAMADOL HCL 50 MG PO TABS: 50.0000 mg | ORAL_TABLET | Freq: Four times a day (QID) | ORAL | 0 refills | Status: DC | PRN

## 2018-10-01 MED ORDER — ASPIRIN 81 MG PO CHEW: 81.0000 mg | CHEWABLE_TABLET | Freq: Two times a day (BID) | ORAL | 0 refills | Status: DC

## 2018-10-01 NOTE — Plan of Care (Signed)
  Problem: Education: Goal: Knowledge of the prescribed therapeutic regimen will improve Outcome: Adequate for Discharge Goal: Understanding of discharge needs will improve Outcome: Adequate for Discharge Goal: Individualized Educational Video(s) Outcome: Adequate for Discharge   Problem: Activity: Goal: Ability to avoid complications of mobility impairment will improve Outcome: Adequate for Discharge Goal: Ability to tolerate increased activity will improve Outcome: Adequate for Discharge   Problem: Clinical Measurements: Goal: Postoperative complications will be avoided or minimized Outcome: Adequate for Discharge   Problem: Pain Management: Goal: Pain level will decrease with appropriate interventions Outcome: Adequate for Discharge   Problem: Skin Integrity: Goal: Will show signs of wound healing Outcome: Adequate for Discharge   Problem: Clinical Measurements: Goal: Ability to maintain clinical measurements within normal limits will improve Outcome: Adequate for Discharge Goal: Will remain free from infection Outcome: Adequate for Discharge Goal: Diagnostic test results will improve Outcome: Adequate for Discharge Goal: Respiratory complications will improve Outcome: Adequate for Discharge Goal: Cardiovascular complication will be avoided Outcome: Adequate for Discharge   Problem: Elimination: Goal: Will not experience complications related to bowel motility Outcome: Adequate for Discharge Goal: Will not experience complications related to urinary retention Outcome: Adequate for Discharge

## 2018-10-01 NOTE — Plan of Care (Signed)
  Problem: Education: Goal: Knowledge of the prescribed therapeutic regimen will improve Outcome: Progressing   Problem: Activity: Goal: Ability to avoid complications of mobility impairment will improve Outcome: Progressing   Problem: Pain Management: Goal: Pain level will decrease with appropriate interventions Outcome: Progressing   

## 2018-10-01 NOTE — Progress Notes (Signed)
Physical Therapy Treatment Patient Details Name: Jonathan Stephens MRN: [id number] DOB: [birth year] Today's Date: 10/01/2018    History of Present Illness Patient is 58 y.o male s/p Lt THA on 09/30/18.    PT Comments    POD # 1 Assisted OOB.  General bed mobility comments: demonstarted and instructed how to use a belt to self assist LE off and onto bed.  Assisted with amb.  General Gait Details: tolerated an increased distance.  Then returned to room to perform some TE's following HEP handout.  Instructed on proper tech, freq as well as use of ICE.   Addressed all mobility questions, discussed appropriate activity, educated on use of ICE.  Pt ready for D/C to home.   Follow Up Recommendations  Follow surgeon's recommendation for DC plan and follow-up therapies     Equipment Recommendations  Rolling walker with 5" wheels;3in1 (PT)    Recommendations for Other Services       Precautions / Restrictions Precautions Precautions: Fall Restrictions Weight Bearing Restrictions: No    Mobility  Bed Mobility Overal bed mobility: Needs Assistance Bed Mobility: Supine to Sit;Sit to Supine     Supine to sit: Min guard;Min assist Sit to supine: Min assist   General bed mobility comments: demonstarted and instructed how to use a belt to self assist LE off and onto bed  Transfers Overall transfer level: Needs assistance Equipment used: Rolling walker (2 wheeled) Transfers: Sit to/from Stand Sit to Stand: Supervision         General transfer comment: <25% verbal cues for technique and hand placement and technique to push up from bed  Ambulation/Gait Ambulation/Gait assistance: Supervision;Min guard Gait Distance (Feet): 115 Feet Assistive device: Rolling walker (2 wheeled) Gait Pattern/deviations: Step-through pattern;Decreased step length - right;Decreased step length - left;Decreased stride length;Decreased weight shift to left Gait velocity: decreased   General Gait Details:  tolerated an increased distance   Stairs Stairs: Yes Stairs assistance: Min guard Stair Management: Two rails;Forwards Number of Stairs: 2 General stair comments: one initial VC on proper sequenving plus required increased time   Wheelchair Mobility    Modified Rankin (Stroke Patients Only)       Balance                                            Cognition Arousal/Alertness: Awake/alert Behavior During Therapy: WFL for tasks assessed/performed Overall Cognitive Status: Within Functional Limits for tasks assessed                                        Exercises   Total Hip Replacement TE's 10 reps ankle pumps 10 reps knee presses 10 reps heel slides 10 reps SAQ's 10 reps ABD Followed by ICE     General Comments        Pertinent Vitals/Pain Pain Assessment: 0-10 Pain Score: 5  Pain Location: Lt hip Pain Descriptors / Indicators: Aching;Burning;Discomfort Pain Intervention(s): Monitored during session;Repositioned;Ice applied    Home Living                      Prior Function            PT Goals (current goals can now be found in the care plan section) Progress towards PT goals: Progressing toward  goals    Frequency    7X/week      PT Plan Current plan remains appropriate    Co-evaluation              AM-PAC PT "6 Clicks" Mobility   Outcome Measure  Help needed turning from your back to your side while in a flat bed without using bedrails?: A Little Help needed moving from lying on your back to sitting on the side of a flat bed without using bedrails?: A Little Help needed moving to and from a bed to a chair (including a wheelchair)?: A Little Help needed standing up from a chair using your arms (e.g., wheelchair or bedside chair)?: A Little Help needed to walk in hospital room?: A Little Help needed climbing 3-5 steps with a railing? : A Little 6 Click Score: 18    End of Session  Equipment Utilized During Treatment: Gait belt Activity Tolerance: Patient tolerated treatment well Patient left: in chair;with call bell/phone within reach;with chair alarm set;with family/visitor present Nurse Communication: Mobility status PT Visit Diagnosis: Unsteadiness on feet (R26.81);Other abnormalities of gait and mobility (R26.89);Muscle weakness (generalized) (M62.81);Difficulty in walking, not elsewhere classified (R26.2)     Time: 8185-6314 PT Time Calculation (min) (ACUTE ONLY): 49 min  Charges:  $Gait Training: 8-22 mins $Therapeutic Exercise: 8-22 mins $Therapeutic Activity: 8-22 mins                     Felecia Shelling  PTA Acute  Rehabilitation Services Pager      [phone] Office      [phone]

## 2018-10-01 NOTE — TOC Initial Note (Signed)
Transition of Care [facility]) - Initial/Assessment Note    Patient Details  Name: Jonathan Stephens MRN: [id number] Date of Birth: [birth year]  Transition of [facility] CM/SW Contact:    Lia Hopping, [place] Phone Number: 10/01/2018, 11:24 AM  Clinical Narrative:                 Therapy [facility] RW and 3 IN 1 ordered through [place]    Barriers to Discharge: No Barriers Identified   Patient Goals and CMS Choice Patient states their goals for this hospitalization and ongoing recovery are:: to get better CMS Medicare.gov Compare Post Acute Care list provided to:: [facility] discussed in Ortho. office with the patient.)    Expected Discharge Plan and Services           Expected Discharge Date: 10/01/18               DME Arranged: Gilford Rile rolling, 3-N-1   Date DME Agency Contacted: 10/01/18 Time DME Agency Contacted: 0900 Representative spoke with at DME Agency: Ovid Curd HH Arranged: PT [place]: [facility] [employer] Date [place]: 10/01/18 Time [place]: 1119 Representative spoke with at [place]: [place] Arrangements/Services                       Activities of Daily Living Home Assistive Devices/Equipment: Dentures (specify type) ADL Screening (condition at time of admission) Patient's cognitive ability adequate to safely complete daily activities?: Yes Is the patient deaf or have difficulty hearing?: No Does the patient have difficulty seeing, even when wearing glasses/contacts?: No Does the patient have difficulty concentrating, remembering, or making decisions?: No Patient able to express need for assistance with ADLs?: Yes Does the patient have difficulty dressing or bathing?: No Independently performs ADLs?: Yes (appropriate for developmental age) Does the patient have difficulty walking or climbing stairs?: No Weakness of Legs: None Weakness of Arms/Hands: None  Permission Sought/Granted                   Emotional Assessment              Admission diagnosis:  LEFT HIP DEGENERATIVE JOINT DISEASE Patient Active Problem List   Diagnosis Date Noted  . Primary localized osteoarthritis of left hip 09/30/2018  . Primary osteoarthritis of left hip 09/30/2018   PCP:  Barbaraann Boys, MD Pharmacy:   [id number] - [place], [place] [address] Phone: [phone] Fax: [phone]     Social Determinants of Health (SDOH) Interventions    Readmission Risk Interventions No flowsheet data found.

## 2018-10-01 NOTE — Discharge Summary (Signed)
Patient ID: Jonathan Stephens MRN: [id number] DOB/AGE: 60-Jan-1962 60 y.o.  Admit date: 09/30/2018 Discharge date: 10/01/2018  Admission Diagnoses:  Principal Problem:   Primary localized osteoarthritis of left hip Active Problems:   Primary osteoarthritis of left hip   Discharge Diagnoses:  Same  History reviewed. No pertinent past medical history.  Surgeries: Procedure(s): LEFT TOTAL HIP ARTHROPLASTY ANTERIOR APPROACH on 09/30/2018   Consultants:   Discharged Condition: Improved  Hospital Course: Jonathan Stephens is an 58 y.o. male who was admitted 09/30/2018 for operative treatment ofPrimary localized osteoarthritis of left hip. Patient has severe unremitting pain that affects sleep, daily activities, and work/hobbies. After pre-op clearance the patient was taken to the operating room on 09/30/2018 and underwent  Procedure(s): LEFT TOTAL HIP ARTHROPLASTY ANTERIOR APPROACH.    Patient was given perioperative antibiotics:  Anti-infectives (From admission, onward)   Start     Dose/Rate Route Frequency Ordered Stop   09/30/18 1600  ceFAZolin (ANCEF) IVPB 2g/100 mL premix     2 g 200 mL/hr over 30 Minutes Intravenous Every 6 hours 09/30/18 1355 09/30/18 2204   09/30/18 0900  ceFAZolin (ANCEF) IVPB 2g/100 mL premix     2 g 200 mL/hr over 30 Minutes Intravenous On call to O.R. 09/30/18 0845 09/30/18 1016       Patient was given sequential compression devices, early ambulation, and chemoprophylaxis to prevent DVT.  Patient benefited maximally from hospital stay and there were no complications.    Recent vital signs:  Patient Vitals for the past 24 hrs:  BP Temp Temp src Pulse Resp SpO2 Height Weight  10/01/18 0447 (!) 156/79 98.8 F (37.1 C) - 83 16 95 % - -  09/30/18 2146 126/68 98.5 F (36.9 C) - 71 18 100 % - -  09/30/18 1718 (!) 148/71 100 F (37.8 C) Oral 76 16 98 % - -  09/30/18 1619 (!) 142/85 97.8 F (36.6 C) Oral 77 16 100 % - -  09/30/18 1506 121/81 98 F (36.7 C)  Oral 86 16 100 % - -  09/30/18 1351 124/79 - - 62 16 100 % - -  09/30/18 1315 - 98 F (36.7 C) - - - - - -  09/30/18 1215 - - - 65 - - - -  09/30/18 1147 115/76 (!) 97.4 F (36.3 C) - 72 10 100 % - -  09/30/18 0857 - - - - - - 6\' 2"  (1.88 m) 101.6 kg  09/30/18 0818 (!) 141/86 98.3 F (36.8 C) Oral 87 17 100 % - -     Recent laboratory studies: No results for input(s): WBC, HGB, HCT, PLT, NA, K, CL, CO2, BUN, CREATININE, GLUCOSE, INR, CALCIUM in the last 72 hours.  Invalid input(s): PT, 2   Discharge Medications:   Allergies as of 10/01/2018   No Known Allergies     Medication List    STOP taking these medications   Fish Oil 1000 MG Caps   ibuprofen 800 MG tablet Commonly known as: ADVIL   multivitamin with minerals tablet     TAKE these medications   aspirin 81 MG chewable tablet Chew 1 tablet (81 mg total) by mouth 2 (two) times daily.   cyclobenzaprine 10 MG tablet Commonly known as: FLEXERIL Take 10 mg by mouth 3 (three) times daily as needed for muscle spasms.   gabapentin 300 MG capsule Commonly known as: NEURONTIN Take 300 mg by mouth 3 (three) times daily.   GLUCOSAMINE CHOND COMPLEX/MSM PO Take 1 tablet  by mouth daily.   traMADol 50 MG tablet Commonly known as: Ultram Take 1 tablet (50 mg total) by mouth every 6 (six) hours as needed.            Durable Medical Equipment  (From admission, onward)         Start     Ordered   09/30/18 1355  DME Walker rolling  Once    Question:  Patient needs a walker to treat with the following condition  Answer:  Primary osteoarthritis of left hip   09/30/18 1355   09/30/18 1355  DME 3 n 1  Once     09/30/18 1355   09/30/18 1355  DME Bedside commode  Once    Question:  Patient needs a bedside commode to treat with the following condition  Answer:  Primary osteoarthritis of left hip   09/30/18 1355          Diagnostic Studies: Dg Chest 2 View  Result Date: 09/23/2018 CLINICAL DATA:  Preoperative  evaluation for hip replacement surgery, smoker EXAM: CHEST - 2 VIEW COMPARISON:  None FINDINGS: Normal heart size, mediastinal contours, and pulmonary vascularity. Emphysematous and bronchitic changes consistent with COPD. Scarring identified at the lateral aspect of the mid to lower RIGHT lung. No acute infiltrate, pleural effusion, or pneumothorax. No acute osseous findings. IMPRESSION: COPD changes with scarring at the lateral aspect of the mid inferior RIGHT lung. No acute abnormalities. Electronically Signed   By: Ulyses Southward M.D.   On: 09/23/2018 08:23   Dg C-arm 1-60 Min-no Report  Result Date: 09/30/2018 Fluoroscopy was utilized by the requesting physician.  No radiographic interpretation.   Dg Hip Operative Unilat W Or W/o Pelvis Left  Result Date: 09/30/2018 CLINICAL DATA:  Left hip replacement EXAM: OPERATIVE LEFT HIP WITH PELVIS COMPARISON:  None. FLUOROSCOPY TIME:  Radiation Exposure Index (as provided by the fluoroscopic device): 9.09 mGy If the device does not provide the exposure index: Fluoroscopy Time:  33 seconds Number of Acquired Images:  2 FINDINGS: Left hip prosthesis is noted in satisfactory position. No acute bony or soft tissue abnormality is noted. IMPRESSION: Status post left hip replacement Electronically Signed   By: Alcide Clever M.D.   On: 09/30/2018 11:28    Disposition: Discharge disposition: 01-Home or Self Care       Discharge Instructions    Call MD / Call 911   Complete by: As directed    If you experience chest pain or shortness of breath, CALL 911 and be transported to the hospital emergency room.  If you develope a fever above 101 F, pus (white drainage) or increased drainage or redness at the wound, or calf pain, call your surgeon's office.   Constipation Prevention   Complete by: As directed    Drink plenty of fluids.  Prune juice may be helpful.  You may use a stool softener, such as Colace (over the counter) 100 mg twice a day.  Use MiraLax (over  the counter) for constipation as needed.   Diet - low sodium heart healthy   Complete by: As directed    Discharge instructions   Complete by: As directed    INSTRUCTIONS AFTER JOINT REPLACEMENT   Remove items at home which could result in a fall. This includes throw rugs or furniture in walking pathways ICE to the affected joint every three hours while awake for 30 minutes at a time, for at least the first 3-5 days, and then as needed for pain  and swelling.  Continue to use ice for pain and swelling. You may notice swelling that will progress down to the foot and ankle.  This is normal after surgery.  Elevate your leg when you are not up walking on it.   Continue to use the breathing machine you got in the hospital (incentive spirometer) which will help keep your temperature down.  It is common for your temperature to cycle up and down following surgery, especially at night when you are not up moving around and exerting yourself.  The breathing machine keeps your lungs expanded and your temperature down.   DIET:  As you were doing prior to hospitalization, we recommend a well-balanced diet.  DRESSING / WOUND CARE / SHOWERING  You may shower 3 days after surgery, but keep the wounds dry during showering.  You may use an occlusive plastic wrap (Press'n Seal for example), NO SOAKING/SUBMERGING IN THE BATHTUB.  If the bandage gets wet, change with a clean dry gauze.  If the incision gets wet, pat the wound dry with a clean towel.  ACTIVITY  Increase activity slowly as tolerated, but follow the weight bearing instructions below.   No driving for 6 weeks or until further direction given by your physician.  You cannot drive while taking narcotics.  No lifting or carrying greater than 10 lbs. until further directed by your surgeon. Avoid periods of inactivity such as sitting longer than an hour when not asleep. This helps prevent blood clots.  You may return to work once you are authorized by your  doctor.     WEIGHT BEARING   Weight bearing as tolerated with assist device (walker, cane, etc) as directed, use it as long as suggested by your surgeon or therapist, typically at least 4-6 weeks.   EXERCISES  Results after joint replacement surgery are often greatly improved when you follow the exercise, range of motion and muscle strengthening exercises prescribed by your doctor. Safety measures are also important to protect the joint from further injury. Any time any of these exercises cause you to have increased pain or swelling, decrease what you are doing until you are comfortable again and then slowly increase them. If you have problems or questions, call your caregiver or physical therapist for advice.   Rehabilitation is important following a joint replacement. After just a few days of immobilization, the muscles of the leg can become weakened and shrink (atrophy).  These exercises are designed to build up the tone and strength of the thigh and leg muscles and to improve motion. Often times heat used for twenty to thirty minutes before working out will loosen up your tissues and help with improving the range of motion but do not use heat for the first two weeks following surgery (sometimes heat can increase post-operative swelling).   These exercises can be done on a training (exercise) mat, on the floor, on a table or on a bed. Use whatever works the best and is most comfortable for you.    Use music or television while you are exercising so that the exercises are a pleasant break in your day. This will make your life better with the exercises acting as a break in your routine that you can look forward to.   Perform all exercises about fifteen times, three times per day or as directed.  You should exercise both the operative leg and the other leg as well.   Exercises include:   Quad Sets - Tighten up the muscle  on the front of the thigh (Quad) and hold for 5-10 seconds.   Straight Leg  Raises - With your knee straight (if you were given a brace, keep it on), lift the leg to 60 degrees, hold for 3 seconds, and slowly lower the leg.  Perform this exercise against resistance later as your leg gets stronger.  Leg Slides: Lying on your back, slowly slide your foot toward your buttocks, bending your knee up off the floor (only go as far as is comfortable). Then slowly slide your foot back down until your leg is flat on the floor again.  Angel Wings: Lying on your back spread your legs to the side as far apart as you can without causing discomfort.  Hamstring Strength:  Lying on your back, push your heel against the floor with your leg straight by tightening up the muscles of your buttocks.  Repeat, but this time bend your knee to a comfortable angle, and push your heel against the floor.  You may put a pillow under the heel to make it more comfortable if necessary.   A rehabilitation program following joint replacement surgery can speed recovery and prevent re-injury in the future due to weakened muscles. Contact your doctor or a physical therapist for more information on knee rehabilitation.    CONSTIPATION  Constipation is defined medically as fewer than three stools per week and severe constipation as less than one stool per week.  Even if you have a regular bowel pattern at home, your normal regimen is likely to be disrupted due to multiple reasons following surgery.  Combination of anesthesia, postoperative narcotics, change in appetite and fluid intake all can affect your bowels.   YOU MUST use at least one of the following options; they are listed in order of increasing strength to get the job done.  They are all available over the counter, and you may need to use some, POSSIBLY even all of these options:    Drink plenty of fluids (prune juice may be helpful) and high fiber foods Colace 100 mg by mouth twice a day  Senokot for constipation as directed and as needed Dulcolax  (bisacodyl), take with full glass of water  Miralax (polyethylene glycol) once or twice a day as needed.  If you have tried all these things and are unable to have a bowel movement in the first 3-4 days after surgery call either your surgeon or your primary doctor.    If you experience loose stools or diarrhea, hold the medications until you stool forms back up.  If your symptoms do not get better within 1 week or if they get worse, check with your doctor.  If you experience "the worst abdominal pain ever" or develop nausea or vomiting, please contact the office immediately for further recommendations for treatment.   ITCHING:  If you experience itching with your medications, try taking only a single pain pill, or even half a pain pill at a time.  You can also use Benadryl over the counter for itching or also to help with sleep.   TED HOSE STOCKINGS:  Use stockings on both legs until for at least 2 weeks or as directed by physician office. They may be removed at night for sleeping.  MEDICATIONS:  See your medication summary on the "After Visit Summary" that nursing will review with you.  You may have some home medications which will be placed on hold until you complete the course of blood thinner medication.  It is  important for you to complete the blood thinner medication as prescribed.  PRECAUTIONS:  If you experience chest pain or shortness of breath - call 911 immediately for transfer to the hospital emergency department.   If you develop a fever greater that 101 F, purulent drainage from wound, increased redness or drainage from wound, foul odor from the wound/dressing, or calf pain - CONTACT YOUR SURGEON.                                                   FOLLOW-UP APPOINTMENTS:  If you do not already have a post-op appointment, please call the office for an appointment to be seen by your surgeon.  Guidelines for how soon to be seen are listed in your "After Visit Summary", but are typically  between 1-4 weeks after surgery.  OTHER INSTRUCTIONS:   Knee Replacement:  Do not place pillow under knee, focus on keeping the knee straight while resting. CPM instructions: 0-90 degrees, 2 hours in the morning, 2 hours in the afternoon, and 2 hours in the evening. Place foam block, curve side up under heel at all times except when in CPM or when walking.  DO NOT modify, tear, cut, or change the foam block in any way.  MAKE SURE YOU:  Understand these instructions.  Get help right away if you are not doing well or get worse.    Thank you for letting us be a part of your medical care team.  It is a privilege we respect greatly.  We hope these instructions will help you stay on track for a fast and full recovery!   Increase activity slowly as tolerated   Complete by: As directed       Follow-up Information    Marcene Corning, MD. Schedule an appointment as soon as possible for a visit in 2 weeks.   Specialty: Orthopedic Surgery Contact information: [address] [place] [address] [phone]            Signed: Ginger Organ Orlo Brickle 10/01/2018, 7:56 AM

## 2018-10-01 NOTE — Progress Notes (Signed)
Subjective: 1 Day Post-Op Procedure(s) (LRB): LEFT TOTAL HIP ARTHROPLASTY ANTERIOR APPROACH (Left)   Patient doing well. States that he feels great and is looking forward to going home this morning.  Activity level:  wbat Diet tolerance:  ok Voiding:  ok Patient reports pain as mild.    Objective: Vital signs in last 24 hours: Temp:  [97.4 F (36.3 C)-100 F (37.8 C)] 98.8 F (37.1 C) (09/30 0447) Pulse Rate:  [62-87] 83 (09/30 0447) Resp:  [10-18] 16 (09/30 0447) BP: (115-156)/(68-86) 156/79 (09/30 0447) SpO2:  [95 %-100 %] 95 % (09/30 0447) Weight:  [101.6 kg] 101.6 kg (09/29 0857)  Labs: No results for input(s): HGB in the last 72 hours. No results for input(s): WBC, RBC, HCT, PLT in the last 72 hours. No results for input(s): NA, K, CL, CO2, BUN, CREATININE, GLUCOSE, CALCIUM in the last 72 hours. No results for input(s): LABPT, INR in the last 72 hours.  Physical Exam:  Neurologically intact ABD soft Neurovascular intact Sensation intact distally Intact pulses distally Dorsiflexion/Plantar flexion intact Incision: dressing C/D/I and no drainage No cellulitis present Compartment soft  Assessment/Plan:  1 Day Post-Op Procedure(s) (LRB): LEFT TOTAL HIP ARTHROPLASTY ANTERIOR APPROACH (Left) Advance diet Up with therapy Discharge home today after PT. Continue on 81mg  asa BID x 4 weeks post op for DVT Prevention. Follow up in office 2 weeks post op.  Jonathan Stephens 10/01/2018, 7:51 AM

## 2018-10-01 NOTE — Anesthesia Postprocedure Evaluation (Signed)
Anesthesia Post Note  Patient: Jonathan Stephens  Procedure(s) Performed: LEFT TOTAL HIP ARTHROPLASTY ANTERIOR APPROACH (Left Hip)     Patient location during evaluation: PACU Anesthesia Type: Spinal Level of consciousness: awake Pain management: pain level controlled Vital Signs Assessment: post-procedure vital signs reviewed and stable Respiratory status: spontaneous breathing Cardiovascular status: stable Postop Assessment: no headache, no backache, spinal receding, patient able to bend at knees and no apparent nausea or vomiting Anesthetic complications: no    Last Vitals:  Vitals:   10/01/18 0447 10/01/18 0944  BP: (!) 156/79 110/67  Pulse: 83 86  Resp: 16 20  Temp: 37.1 C 37.2 C  SpO2: 95% 94%    Last Pain:  Vitals:   10/01/18 0954  TempSrc:   PainSc: 5    Pain Goal: Patients Stated Pain Goal: 3 (10/01/18 0954)                 Huston Foley

## 2019-09-08 NOTE — Progress Notes (Signed)
[facility]  [address] [place], [place] [address] Phone: [phone]  Fax: [phone]   Clinic Day:  09/09/2019  Referring physician: Orene Desanctis, MD  Chief Complaint: Jonathan Stephens is a 59 y.o. male with an abnormal SPEP who is referred in consultation with Dr. Orene Desanctis for assessment and management.   HPI: The patient was last seen by Dr. Richardine Service on 07/23/2019 for an annual visit.  CBC revealed a hematocrit of 37.3, hemoglobin 13.4, MCV 81, platelets 279,000, WBC 6400 with an ANC of 3400.  Creatinine was 1.1.  Calcium was 9.7 with an albumin of 4.0.  Total protein was 8.5 (6.2-8.1).  Additional labs on 08/17/2019 revealed a hematocrit of 38.4, hemoglobin 13.6, MCV 82, platelets 325,000, WBC 8,000. SPEP revealed an asymmetric peak in the gamma region. No monoclonal component was identified. 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  LDH was 129.  Beta-2 microglobulin was 3.05 (0.80-2.34). PSA was 0.73.  Symptomatically, he feels "good".  He has always gotten headaches. He has an occasional runny nose and constipation. He reports nocturia and numbness in his toes. 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  MRI lumbar spine on 02/28/2017 revealed multilevel spondylosis and neuroforaminal stenosis.  The patient denies fevers, sweats, changes in vision, sore throat, cough, shortness of breath, chest pain, palpitations, nausea, vomiting, diarrhea, reflux, skin changes, weakness, balance or coordination problems, and bleeding of any kind.  He is due for a colonoscopy in 2025. He denies a history of blood disorders or cancers.  He had hepatitis C about 5 years ago and was treated.   History reviewed. No pertinent past medical history.  Past Surgical History:  Procedure Laterality Date  . MOHS SURGERY  03/2018  . TOTAL HIP ARTHROPLASTY Left 09/30/2018   Procedure: LEFT TOTAL HIP  ARTHROPLASTY ANTERIOR APPROACH;  Surgeon: Marcene Corning, MD;  Location: [facility];  Service: Orthopedics;  Laterality: Left;    History reviewed. No pertinent family history.  Social History:  reports that he has been smoking cigarettes. He has never used smokeless tobacco. He reports previous alcohol use. He reports that he does not use drugs. 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 He has smoked a pack per day for 45 years. He is on the low-dose chest CT program. He states that he may have been exposed to radiation or toxins through work. 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 The patient's first wife of 34 years passed away from stage IV breast cancer. He is originally from [address] and also lived in [place] before moving to [place]. He has three grandsons (16, 8, and 94 years old). His current wife is an Charity fundraiser.  The patient is accompanied by his wife, Jonathan Stephens, today.  Allergies:  Allergies  Allergen Reactions  . Bee Venom Swelling    Current Medications: Current Outpatient Medications  Medication Sig Dispense Refill  . cyclobenzaprine (FLEXERIL) 10 MG tablet Take 10 mg by mouth 3 (three) times daily as needed for muscle spasms.    Marland Kitchen gabapentin (NEURONTIN) 300 MG capsule Take 300 mg by mouth 3 (three) times daily.    . Misc Natural Products (GLUCOSAMINE CHOND COMPLEX/MSM PO) Take 1 tablet by mouth daily.     No current facility-administered medications for this visit.    Review of Systems  Constitutional: Negative for chills, diaphoresis, fever, malaise/fatigue and weight loss.  Feels "good".  HENT: Negative for congestion, ear discharge, ear pain, hearing loss, nosebleeds, sinus pain, sore throat and tinnitus.        Occasional runny nose.  Eyes: Negative for blurred vision.  Respiratory: Negative for cough, hemoptysis, sputum production and shortness of breath.   Cardiovascular: Negative for chest pain, palpitations and leg swelling.    Gastrointestinal: Positive for constipation (occasional). Negative for abdominal pain, blood in stool, diarrhea, heartburn, melena, nausea and vomiting.  Genitourinary: Negative for dysuria, frequency, hematuria and urgency.       Nocturia.  Musculoskeletal: Negative for back pain, joint pain, myalgias and neck pain.  Skin: Negative for itching and rash.  Neurological: Positive for sensory change (numbness in toes) and headaches (occasional). Negative for dizziness, tingling and weakness.  Endo/Heme/Allergies: Does not bruise/bleed easily.  Psychiatric/Behavioral: Negative for depression and memory loss. The patient is not nervous/anxious and does not have insomnia.   All other systems reviewed and are negative.  Performance status (ECOG): 1  Vitals Blood pressure 137/84, pulse 77, temperature (!) 97 F (36.1 C), temperature source Tympanic, resp. rate 18, height 6\' 2"  (1.88 m), weight 221 lb 0.2 oz (100.3 kg), SpO2 98 %.   Physical Exam Vitals and nursing note reviewed.  Constitutional:      General: He is not in acute distress.    Appearance: He is not diaphoretic.     Comments: Short hair.  HENT:     Head: Normocephalic and atraumatic.     Mouth/Throat:     Mouth: Mucous membranes are moist.     Pharynx: Oropharynx is clear.  Eyes:     General: No scleral icterus.    Extraocular Movements: Extraocular movements intact.     Conjunctiva/sclera: Conjunctivae normal.     Pupils: Pupils are equal, round, and reactive to light.     Comments: Glasses.  Brown eyes.  Cardiovascular:     Rate and Rhythm: Normal rate and regular rhythm.     Heart sounds: Normal heart sounds. No murmur heard.   Pulmonary:     Effort: Pulmonary effort is normal. No respiratory distress.     Breath sounds: Normal breath sounds. No wheezing or rales.  Chest:     Chest wall: No tenderness.  Abdominal:     General: Bowel sounds are normal. There is no distension.     Palpations: Abdomen is soft. There  is no hepatomegaly, splenomegaly or mass.     Tenderness: There is no abdominal tenderness. There is no guarding or rebound.  Musculoskeletal:        General: No swelling or tenderness. Normal range of motion.     Cervical back: Normal range of motion and neck supple.  Lymphadenopathy:     Head:     Right side of head: No preauricular, posterior auricular or occipital adenopathy.     Left side of head: No preauricular, posterior auricular or occipital adenopathy.     Cervical: No cervical adenopathy.     Upper Body:     Right upper body: No supraclavicular or axillary adenopathy.     Left upper body: No supraclavicular or axillary adenopathy.     Lower Body: No right inguinal adenopathy. No left inguinal adenopathy.  Skin:    General: Skin is warm and dry.  Neurological:     Mental Status: He is alert and oriented to person, place, and time.  Psychiatric:        Behavior: Behavior normal.  Thought Content: Thought content normal.        Judgment: Judgment normal.    No visits with results within 3 Day(s) from this visit.  Latest known visit with results is:  Hospital Outpatient Visit on 09/26/2018  Component Date Value Ref Range Status  . SARS-CoV-2, NAA 09/26/2018 NOT DETECTED  NOT DETECTED Final   Comment: (NOTE) This nucleic acid amplification test was developed and its performance characteristics determined by [employer]. Nucleic acid amplification tests include PCR and TMA. This test has not been FDA cleared or approved. This test has been authorized by FDA under an Emergency Use Authorization (EUA). This test is only authorized for the duration of time the declaration that circumstances exist justifying the authorization of the emergency use of in vitro diagnostic tests for detection of SARS-CoV-2 virus and/or diagnosis of COVID-19 infection under section 564(b)(1) of the Act, 21 U.S.C. 409WJX-9(J) (1), unless the authorization is terminated or revoked  sooner. When diagnostic testing is negative, the possibility of a false negative result should be considered in the context of a patient's recent exposures and the presence of clinical signs and symptoms consistent with COVID-19. An individual without symptoms of COVID- 19 and who is not shedding SARS-CoV-2 vi                          rus would expect to have a negative (not detected) result in this assay. Performed At: [facility] [address] [place], [place] [id number] Jolene Schimke MD [id number]   . Coronavirus Source 09/26/2018 NASOPHARYNGEAL   Final   Performed at [facility], [address], [place], [place] [address]    Assessment:  BRENNDEN MASTEN is a 59 y.o. male an abnormal SPEP.  Labs on 08/17/2019 revealed a hematocrit of 38.4, hemoglobin 13.6, MCV 82, platelets 325,000, WBC 8,000. SPEP revealed an asymmetric peak in the gamma region. No monoclonal component was identified. 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  LDH was 129. Beta-2 microglobulin was 3.05 (elevated).   He has chronic back pain.  MRI lumbar spine on 02/28/2017 revealed multilevel spondylosis and neuroforaminal stenosis.  He has a history of hepatitis C which was treated.  Symptomatically, he feels "good".  He denies any bone pain or recurrent infections.  Exam reveals no adenopathy or hepatosplenomegaly.  Plan: 1.   Labs today:  CBC with diff, CMP, myeloma panel, FLCA. 2.   24 hour urine for UPEP and free light chains. 3.   Abnormal SPEP  Etiology unclear.             Discuss differential diagnosis:  monoclonal gammopathy of unknown significance (MGUS), lymphoplasmacytic disorders or possibly no abnormality.              Discuss lymphoplasmacytic disorders and CRAB criteria for myeloma.             Discuss work-up. 4.   RTC in 2 weeks for MD assessment, review of work-up and discussion regarding direction of therapy.  I discussed the assessment and treatment  plan with the patient.  The patient was provided an opportunity to ask questions and all were answered.  The patient agreed with the plan and demonstrated an understanding of the instructions.  The patient was advised to call back if the symptoms worsen or if the condition fails to improve as anticipated.  I provided 27 minutes of face-to-face time during this this encounter and > 50% was spent  counseling as documented under my assessment and plan. An additional 10 minutes were spent reviewing his chart (Epic and Care Everywhere) including notes, labs, and imaging studies.    Latrese Carolan C. Merlene Pullingorcoran, MD, PhD    09/09/2019, 2:05 PM  I, Danella PentonEmily J Tufford, am acting as Neurosurgeonscribe for General MotorsMelissa C. Merlene Pullingorcoran, MD, PhD.  I, Kaytelynn Scripter C. Merlene Pullingorcoran, MD, have reviewed the above documentation for accuracy and completeness, and I agree with the above.

## 2019-09-09 ENCOUNTER — Other Ambulatory Visit: Payer: Self-pay

## 2019-09-09 ENCOUNTER — Inpatient Hospital Stay: Payer: Federal, State, Local not specified - PPO

## 2019-09-09 ENCOUNTER — Inpatient Hospital Stay
Payer: Federal, State, Local not specified - PPO | Attending: Hematology and Oncology | Admitting: Hematology and Oncology

## 2019-09-09 ENCOUNTER — Encounter: Payer: Self-pay | Admitting: Hematology and Oncology

## 2019-09-09 VITALS — BP 137/84 | HR 77 | Temp 97.0°F | Resp 18 | Ht 74.0 in | Wt 221.0 lb

## 2019-09-09 DIAGNOSIS — M47819 Spondylosis without myelopathy or radiculopathy, site unspecified: Secondary | ICD-10-CM | POA: Insufficient documentation

## 2019-09-09 DIAGNOSIS — Z8619 Personal history of other infectious and parasitic diseases: Secondary | ICD-10-CM | POA: Diagnosis not present

## 2019-09-09 DIAGNOSIS — D649 Anemia, unspecified: Secondary | ICD-10-CM | POA: Insufficient documentation

## 2019-09-09 DIAGNOSIS — M549 Dorsalgia, unspecified: Secondary | ICD-10-CM | POA: Insufficient documentation

## 2019-09-09 DIAGNOSIS — F17210 Nicotine dependence, cigarettes, uncomplicated: Secondary | ICD-10-CM | POA: Diagnosis not present

## 2019-09-09 DIAGNOSIS — R778 Other specified abnormalities of plasma proteins: Secondary | ICD-10-CM | POA: Diagnosis not present

## 2019-09-09 DIAGNOSIS — G8929 Other chronic pain: Secondary | ICD-10-CM | POA: Insufficient documentation

## 2019-09-09 LAB — CBC WITH DIFFERENTIAL/PLATELET
Abs Immature Granulocytes: 0.01 10*3/uL (ref 0.00–0.07)
Basophils Absolute: 0 10*3/uL (ref 0.0–0.1)
Basophils Relative: 1 %
Eosinophils Absolute: 0.2 10*3/uL (ref 0.0–0.5)
Eosinophils Relative: 3 %
HCT: 33.9 % — ABNORMAL LOW (ref 39.0–52.0)
Hemoglobin: 11.7 g/dL — ABNORMAL LOW (ref 13.0–17.0)
Immature Granulocytes: 0 %
Lymphocytes Relative: 32 %
Lymphs Abs: 2 10*3/uL (ref 0.7–4.0)
MCH: 28 pg (ref 26.0–34.0)
MCHC: 34.5 g/dL (ref 30.0–36.0)
MCV: 81.1 fL (ref 80.0–100.0)
Monocytes Absolute: 0.4 10*3/uL (ref 0.1–1.0)
Monocytes Relative: 7 %
Neutro Abs: 3.6 10*3/uL (ref 1.7–7.7)
Neutrophils Relative %: 57 %
Platelets: 283 10*3/uL (ref 150–400)
RBC: 4.18 MIL/uL — ABNORMAL LOW (ref 4.22–5.81)
RDW: 12.9 % (ref 11.5–15.5)
WBC: 6.3 10*3/uL (ref 4.0–10.5)
nRBC: 0 % (ref 0.0–0.2)

## 2019-09-09 LAB — COMPREHENSIVE METABOLIC PANEL
ALT: 14 U/L (ref 0–44)
AST: 17 U/L (ref 15–41)
Albumin: 3.8 g/dL (ref 3.5–5.0)
Alkaline Phosphatase: 83 U/L (ref 38–126)
Anion gap: 11 (ref 5–15)
BUN: 12 mg/dL (ref 8–23)
CO2: 24 mmol/L (ref 22–32)
Calcium: 9.2 mg/dL (ref 8.9–10.3)
Chloride: 103 mmol/L (ref 98–111)
Creatinine, Ser: 0.88 mg/dL (ref 0.61–1.24)
GFR calc Af Amer: >60 mL/min (ref >60)
GFR calc non Af Amer: >60 mL/min (ref >60)
Glucose, Bld: 99 mg/dL (ref 70–99)
Potassium: 4.1 mmol/L (ref 3.5–5.1)
Sodium: 138 mmol/L (ref 135–145)
Total Bilirubin: 0.3 mg/dL (ref 0.3–1.2)
Total Protein: 8.5 g/dL — ABNORMAL HIGH (ref 6.5–8.1)

## 2019-09-10 LAB — KAPPA/LAMBDA LIGHT CHAINS
Kappa free light chain: 57.4 mg/L — ABNORMAL HIGH (ref 3.3–19.4)
Kappa, lambda light chain ratio: 2.1 — ABNORMAL HIGH (ref 0.26–1.65)
Lambda free light chains: 27.3 mg/L — ABNORMAL HIGH (ref 5.7–26.3)

## 2019-09-10 LAB — BETA 2 MICROGLOBULIN, SERUM: Beta-2 Microglobulin: 2.2 mg/L (ref 0.6–2.4)

## 2019-09-11 LAB — MULTIPLE MYELOMA PANEL, SERUM
Albumin SerPl Elph-Mcnc: 3.6 g/dL (ref 2.9–4.4)
Albumin/Glob SerPl: 0.9 (ref 0.7–1.7)
Alpha 1: 0.4 g/dL (ref 0.0–0.4)
Alpha2 Glob SerPl Elph-Mcnc: 1.2 g/dL — ABNORMAL HIGH (ref 0.4–1.0)
B-Globulin SerPl Elph-Mcnc: 1.2 g/dL (ref 0.7–1.3)
Gamma Glob SerPl Elph-Mcnc: 1.6 g/dL (ref 0.4–1.8)
Globulin, Total: 4.3 g/dL — ABNORMAL HIGH (ref 2.2–3.9)
IgA: 372 mg/dL (ref 61–437)
IgG (Immunoglobin G), Serum: 1570 mg/dL (ref 603–1613)
IgM (Immunoglobulin M), Srm: 69 mg/dL (ref 20–172)
Total Protein ELP: 7.9 g/dL (ref 6.0–8.5)

## 2019-09-16 ENCOUNTER — Other Ambulatory Visit: Payer: Self-pay

## 2019-09-16 DIAGNOSIS — D649 Anemia, unspecified: Secondary | ICD-10-CM | POA: Diagnosis not present

## 2019-09-16 DIAGNOSIS — R778 Other specified abnormalities of plasma proteins: Secondary | ICD-10-CM

## 2019-09-18 LAB — FREE K+L LT CHAINS,QN,UR
Free Kappa Lt Chains,Ur: 15.01 mg/L (ref 0.63–113.79)
Free Kappa/Lambda Ratio: 6.28 (ref 1.03–31.76)
Free Lambda Lt Chains,Ur: 2.39 mg/L (ref 0.47–11.77)
Total Volume: 3100

## 2019-09-18 LAB — UPEP/TP, 24-HR URINE
Albumin, U: 100 %
Alpha 1, Urine: 0 %
Alpha 2, Urine: 0 %
Beta, Urine: 0 %
Gamma Globulin, Urine: 0 %
Total Protein, Urine-Ur/day: 155 mg/24 hr — ABNORMAL HIGH (ref 30–150)
Total Protein, Urine: 5 mg/dL
Total Volume: 3100

## 2019-09-23 NOTE — Progress Notes (Signed)
[facility]  [address] [place], [place] [address] Phone: [phone]  Fax: [phone]   Clinic Day:  09/24/2019  Referring physician: Orene Desanctis, MD  Chief Complaint: Jonathan Stephens is a 59 y.o. male with an abnormal SPEP who is seen for review of work-up and discussion regarding direction of therapy.  HPI: The patient was last seen in the hematology clinic on 09/09/2019 for new patient assessment.  He was noted to have an abnormal SPEP on 08/17/2019 beta-2 microglobulin was 3.05 (elevated).  Symptomatically, he felt "good".  He denied any bone pain or recurrent infections.  Exam revealed no adenopathy or hepatosplenomegaly.  Work-up revealed a hematocrit of 33.9, hemoglobin 11.7, MCV 81.1, platelets 283,000, WBC 6,300. Total protein was 8.5 (6.5-8.1).  SPEP revealed no M-spike. Kappa free light chains were 57.4, lambda free light chains 27.3, and ratio 2.10 (0.26 - 1.65).  24 hour UPEP revealed a total protein of 155 mg/24 hours (30-150).  Urine kappa free light chains were 15.01, lambda free light chains 2.39, and ratio 6.28 (normal).  Beta-2 microglobulin was 2.2 (0.6-2.4).   During the interim, he has been good. He reports no new symptoms. The patient denies reflux, blood in the stool, black stools, and hematuria. His wife notes that he fluctuates a lot between diarrhea and constipation. His diet is good.  His last colonoscopy was in 2015 or 2016 and the patient states that it was good.   History reviewed. No pertinent past medical history.  Past Surgical History:  Procedure Laterality Date  . MOHS SURGERY  03/2018  . TOTAL HIP ARTHROPLASTY Left 09/30/2018   Procedure: LEFT TOTAL HIP ARTHROPLASTY ANTERIOR APPROACH;  Surgeon: Marcene Corning, MD;  Location: [facility];  Service: Orthopedics;  Laterality: Left;    History reviewed. No pertinent family history.  Social History:  reports that he has been smoking cigarettes. He has never used  smokeless tobacco. He reports previous alcohol use. He reports that he does not use drugs. 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 He has smoked a pack per day for 45 years. He is on the low-dose chest CT program. He states that he may have been exposed to radiation or toxins through work. 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 The patient's first wife of 34 years passed away from stage IV breast cancer. He is originally from [address] and also lived in [place] before moving to [place]. He has three grandsons (16, 73, and 59 years old). His current wife is an Charity fundraiser.  The patient is accompanied by his wife, [name], today.  Allergies:  Allergies  Allergen Reactions  . Bee Venom Swelling    Current Medications: Current Outpatient Medications  Medication Sig Dispense Refill  . cyclobenzaprine (FLEXERIL) 10 MG tablet Take 10 mg by mouth 3 (three) times daily as needed for muscle spasms.    Marland Kitchen gabapentin (NEURONTIN) 300 MG capsule Take 300 mg by mouth 3 (three) times daily.    . Misc Natural Products (GLUCOSAMINE CHOND COMPLEX/MSM PO) Take 1 tablet by mouth daily.     No current facility-administered medications for this visit.    Review of Systems  Constitutional: Positive for weight loss (2 lbs). Negative for chills, diaphoresis, fever and malaise/fatigue.       Feels "good".  HENT: Negative for congestion, ear discharge, ear pain, hearing loss, nosebleeds, sinus pain, sore throat and tinnitus.  Occasional runny nose.  Eyes: Negative for blurred vision.  Respiratory: Negative for cough, hemoptysis, sputum production and shortness of breath.   Cardiovascular: Negative for chest pain, palpitations and leg swelling.  Gastrointestinal: Positive for constipation (occasional) and diarrhea (occasional). Negative for abdominal pain, blood in stool, heartburn, melena, nausea and vomiting.  Genitourinary: Negative for dysuria, frequency, hematuria and urgency.         Nocturia.  Musculoskeletal: Negative for back pain, joint pain, myalgias and neck pain.  Skin: Negative for itching and rash.  Neurological: Positive for sensory change (numbness in toes) and headaches (occasional). Negative for dizziness, tingling and weakness.  Endo/Heme/Allergies: Does not bruise/bleed easily.  Psychiatric/Behavioral: Negative for depression and memory loss. The patient is not nervous/anxious and does not have insomnia.   All other systems reviewed and are negative.  Performance status (ECOG): 1  Vitals Blood pressure 134/81, pulse 85, temperature 97.8 F (36.6 C), resp. rate 20, weight 219 lb 7.5 oz (99.6 kg), SpO2 98 %.   Physical Exam Vitals and nursing note reviewed.  Constitutional:      General: He is not in acute distress.    Appearance: He is not diaphoretic.     Comments: Short hair.  Eyes:     General: No scleral icterus.    Conjunctiva/sclera: Conjunctivae normal.     Comments: Glasses.  Brown eyes.  Abdominal:     Palpations: There is no hepatomegaly or splenomegaly.  Neurological:     Mental Status: He is alert and oriented to person, place, and time.  Psychiatric:        Behavior: Behavior normal.        Thought Content: Thought content normal.        Judgment: Judgment normal.    No visits with results within 3 Day(s) from this visit.  Latest known visit with results is:  Orders Only on 09/16/2019  Component Date Value Ref Range Status  . Free Kappa Lt Chains,Ur 09/16/2019 15.01  0.63 - 113.79 mg/L Final  . Free Lambda Lt Chains,Ur 09/16/2019 2.39  0.47 - 11.77 mg/L Final  . Free Kappa/Lambda Ratio 09/16/2019 6.28  1.03 - 31.76 Final   Comment: (NOTE) Performed At: [facility] [address] [place], [place] [id number] Jolene Schimke MD [id number]   . Total Volume 09/16/2019 3,100   Final   Performed at [facility], [address] [place], [place] [address]  . Total Protein, Urine 09/16/2019 5.0  Not  Estab. mg/dL Final  . Total Protein, Urine-Ur/day 09/16/2019 155* 30 - 150 mg/24 hr Final  . Albumin, U 09/16/2019 100.0  % Final  . Alpha 1, Urine 09/16/2019 0.0  % Final  . Alpha 2, Urine 09/16/2019 0.0  % Final  . Beta, Urine 09/16/2019 0.0  % Final  . Gamma Globulin, Urine 09/16/2019 0.0  % Final  . M-spike, % 09/16/2019 Not Observed  Not Observed % Final  . Please Note: 09/16/2019 Comment   Final   Comment: (NOTE) Protein electrophoresis scan will follow via computer, mail, or courier delivery. Performed At: [facility] [address] [place], [place] [id number] Jolene Schimke MD [id number]   . Total Volume 09/16/2019 3,100   Final   Performed at [facility], [address], [place], [place] [address]    Assessment:  CLEARENCE VITUG is a 59 y.o. male with an abnormal SPEP.  He has a normocytic anemia.  Labs on 08/17/2019 revealed a hematocrit of 38.4, hemoglobin 13.6, MCV  82, platelets 325,000, WBC 8,000. SPEP revealed an asymmetric peak in the gamma region. No monoclonal component was identified. 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  LDH was 129. Beta-2 microglobulin was 3.05 (elevated).   Work-up on 09/09/2019 revealed a hematocrit of 33.9, hemoglobin 11.7, MCV 81.1, platelets 283,000, WBC 6,300. SPEP revealed no M-spike. Kappa free light chains were 57.4, lambda free light chains 27.3, and ratio 2.10 (0.26 - 1.65). 24 hour UPEP revealed a total protein of 155 mg/24 hours (30-150).  Urine kappa free light chains were 15.01, lambda free light chains 2.39, and ratio 6.28 (normal).  Beta-2 microglobulin was 2.2 (0.6-2.4).   He has chronic back pain.  MRI lumbar spine on 02/28/2017 revealed multilevel spondylosis and neuroforaminal stenosis.  He has a history of hepatitis C which was treated.  Symptomatically, he feels good.  He denies any bleeding.  Plan: 1.   Labs today:  CBC, ferritin, iron studies, B12, folate, TSH, retic. 2.    Abnormal SPEP  Work-up revealed no monoclonal protein.  Reassurance provided. 3.   Normocytic anemia  Hematocrit 33.9.  Hemoglobin 11.7.  MCV 81.1 on 09/09/2019.  Discuss work-up.  Patient denies any bleeding.  He states that he will follow-up with Dr Richardine Service. 4.   RN to call patient with results. 5.   RTC prn.  Addendum: Hematocrit 34.8, hemoglobin 12.1, MCV 80.2.  Ferritin 92 with an iron saturation of 12% (low) and a TIBC of 344.  B12 was 461 and folate 21.8.  Retic 2%.  I discussed the assessment and treatment plan with the patient.  The patient was provided an opportunity to ask questions and all were answered.  The patient agreed with the plan and demonstrated an understanding of the instructions.  The patient was advised to call back if the symptoms worsen or if the condition fails to improve as anticipated.   Alexzandra Bilton C. Merlene Pulling, MD, PhD    09/24/2019, 2:15 PM  I, Danella Penton Tufford, am acting as Neurosurgeon for [employer]. Merlene Pulling, MD, PhD.  I, Rudolfo Brandow C. Merlene Pulling, MD, have reviewed the above documentation for accuracy and completeness, and I agree with the above.

## 2019-09-24 ENCOUNTER — Encounter: Payer: Self-pay | Admitting: Hematology and Oncology

## 2019-09-24 ENCOUNTER — Ambulatory Visit: Payer: Federal, State, Local not specified - PPO

## 2019-09-24 ENCOUNTER — Other Ambulatory Visit: Payer: Self-pay

## 2019-09-24 ENCOUNTER — Inpatient Hospital Stay: Payer: Federal, State, Local not specified - PPO | Admitting: Hematology and Oncology

## 2019-09-24 VITALS — BP 134/81 | HR 85 | Temp 97.8°F | Resp 20 | Wt 219.5 lb

## 2019-09-24 DIAGNOSIS — D649 Anemia, unspecified: Secondary | ICD-10-CM | POA: Insufficient documentation

## 2019-09-24 DIAGNOSIS — R778 Other specified abnormalities of plasma proteins: Secondary | ICD-10-CM | POA: Diagnosis not present

## 2019-09-24 LAB — CBC
HCT: 34.8 % — ABNORMAL LOW (ref 39.0–52.0)
Hemoglobin: 12.1 g/dL — ABNORMAL LOW (ref 13.0–17.0)
MCH: 27.9 pg (ref 26.0–34.0)
MCHC: 34.8 g/dL (ref 30.0–36.0)
MCV: 80.2 fL (ref 80.0–100.0)
Platelets: 253 10*3/uL (ref 150–400)
RBC: 4.34 MIL/uL (ref 4.22–5.81)
RDW: 13.2 % (ref 11.5–15.5)
WBC: 6.9 10*3/uL (ref 4.0–10.5)
nRBC: 0 % (ref 0.0–0.2)

## 2019-09-24 LAB — RETICULOCYTES
Immature Retic Fract: 9.5 % (ref 2.3–15.9)
RBC.: 4.24 MIL/uL (ref 4.22–5.81)
Retic Count, Absolute: 83.5 10*3/uL (ref 19.0–186.0)
Retic Ct Pct: 2 % (ref 0.4–3.1)

## 2019-09-24 LAB — IRON AND TIBC
Iron: 42 ug/dL — ABNORMAL LOW (ref 45–182)
Saturation Ratios: 12 % — ABNORMAL LOW (ref 17.9–39.5)
TIBC: 344 ug/dL (ref 250–450)
UIBC: 302 ug/dL

## 2019-09-24 LAB — TSH: TSH: 1.741 u[IU]/mL (ref 0.350–4.500)

## 2019-09-24 LAB — FERRITIN: Ferritin: 92 ng/mL (ref 24–336)

## 2019-09-24 LAB — FOLATE: Folate: 21.8 ng/mL (ref >5.9)

## 2019-09-24 NOTE — Progress Notes (Signed)
Patient denies any concerns at moment

## 2019-09-25 LAB — VITAMIN B12: Vitamin B-12: 461 pg/mL (ref 180–914)

## 2019-09-30 ENCOUNTER — Encounter: Payer: Self-pay | Admitting: Hematology and Oncology

## 2019-10-14 ENCOUNTER — Other Ambulatory Visit: Payer: Self-pay | Admitting: Neurosurgery

## 2019-11-20 DIAGNOSIS — Z8619 Personal history of other infectious and parasitic diseases: Secondary | ICD-10-CM | POA: Insufficient documentation

## 2019-11-20 DIAGNOSIS — I2510 Atherosclerotic heart disease of native coronary artery without angina pectoris: Secondary | ICD-10-CM | POA: Insufficient documentation

## 2019-12-01 ENCOUNTER — Other Ambulatory Visit (HOSPITAL_COMMUNITY)
Admission: RE | Admit: 2019-12-01 | Discharge: 2019-12-01 | Disposition: A | Discharge status: Home / Self Care | Payer: Federal, State, Local not specified - PPO | Source: Ambulatory Visit | Attending: Neurosurgery | Admitting: Neurosurgery

## 2019-12-01 ENCOUNTER — Other Ambulatory Visit: Payer: Self-pay

## 2019-12-01 ENCOUNTER — Encounter (HOSPITAL_COMMUNITY)
Admission: RE | Admit: 2019-12-01 | Discharge: 2019-12-01 | Disposition: A | Discharge status: Home / Self Care | Payer: Federal, State, Local not specified - PPO | Source: Ambulatory Visit | Attending: Neurosurgery | Admitting: Neurosurgery

## 2019-12-01 ENCOUNTER — Encounter (HOSPITAL_COMMUNITY): Payer: Self-pay

## 2019-12-01 DIAGNOSIS — F17210 Nicotine dependence, cigarettes, uncomplicated: Secondary | ICD-10-CM | POA: Insufficient documentation

## 2019-12-01 DIAGNOSIS — Z20822 Contact with and (suspected) exposure to covid-19: Secondary | ICD-10-CM | POA: Insufficient documentation

## 2019-12-01 DIAGNOSIS — M48061 Spinal stenosis, lumbar region without neurogenic claudication: Secondary | ICD-10-CM | POA: Insufficient documentation

## 2019-12-01 DIAGNOSIS — Z01812 Encounter for preprocedural laboratory examination: Secondary | ICD-10-CM | POA: Insufficient documentation

## 2019-12-01 DIAGNOSIS — Z79899 Other long term (current) drug therapy: Secondary | ICD-10-CM | POA: Insufficient documentation

## 2019-12-01 HISTORY — DX: Inflammatory liver disease, unspecified: K75.9

## 2019-12-01 LAB — TYPE AND SCREEN
ABO/RH(D): O POS
Antibody Screen: NEGATIVE

## 2019-12-01 LAB — SURGICAL PCR SCREEN
MRSA, PCR: NEGATIVE
Staphylococcus aureus: NEGATIVE

## 2019-12-01 LAB — CBC
HCT: 39.4 % (ref 39.0–52.0)
Hemoglobin: 13.4 g/dL (ref 13.0–17.0)
MCH: 27.9 pg (ref 26.0–34.0)
MCHC: 34 g/dL (ref 30.0–36.0)
MCV: 81.9 fL (ref 80.0–100.0)
Platelets: 267 10*3/uL (ref 150–400)
RBC: 4.81 MIL/uL (ref 4.22–5.81)
RDW: 13.4 % (ref 11.5–15.5)
WBC: 7.1 10*3/uL (ref 4.0–10.5)
nRBC: 0 % (ref 0.0–0.2)

## 2019-12-01 LAB — COMPREHENSIVE METABOLIC PANEL
ALT: 19 U/L (ref 0–44)
AST: 24 U/L (ref 15–41)
Albumin: 4.2 g/dL (ref 3.5–5.0)
Alkaline Phosphatase: 92 U/L (ref 38–126)
Anion gap: 12 (ref 5–15)
BUN: 6 mg/dL — ABNORMAL LOW (ref 8–23)
CO2: 26 mmol/L (ref 22–32)
Calcium: 9.7 mg/dL (ref 8.9–10.3)
Chloride: 103 mmol/L (ref 98–111)
Creatinine, Ser: 1.03 mg/dL (ref 0.61–1.24)
GFR, Estimated: >60 mL/min (ref >60)
Glucose, Bld: 96 mg/dL (ref 70–99)
Potassium: 3.6 mmol/L (ref 3.5–5.1)
Sodium: 141 mmol/L (ref 135–145)
Total Bilirubin: 0.6 mg/dL (ref 0.3–1.2)
Total Protein: 8.2 g/dL — ABNORMAL HIGH (ref 6.5–8.1)

## 2019-12-01 LAB — SARS CORONAVIRUS 2 (TAT 6-24 HRS): SARS Coronavirus 2: NEGATIVE

## 2019-12-01 NOTE — Pre-Procedure Instructions (Signed)
[id number] [place], [place] - [address] [place] [address] Phone: [phone] Fax: [phone]     Your procedure is scheduled on Thursday 12/03/19 from 12:15 PM- 5:36 PM.  Report to [facility] Main Entrance "A" at 10:15 A.M., and check in at the Admitting office.  Call this number if you have problems the morning of surgery:  [phone]  Call [phone] if you have any questions prior to your surgery date Monday-Friday 8am-4pm.    Remember:  Do not eat or drink after midnight the night before your surgery.      Take these medicines the morning of surgery with A SIP OF WATER:  IF NEEDED: gabapentin (NEURONTIN)    As of today, STOP taking any Aspirin (unless otherwise instructed by your surgeon) Aleve, Naproxen, Ibuprofen, Motrin, Advil, Goody's, BC's, all herbal medications, fish oil, and all vitamins.       The Morning of Surgery:               Do not wear jewelry.            Do not wear lotions, powders,colognes, or deodorant.            Men may shave face and neck.            Do not bring valuables to the hospital.            [facility] is not responsible for any belongings or valuables.  Do NOT Smoke (Tobacco/Vaping) or drink Alcohol 24 hours prior to your procedure.  If you use a CPAP at night, you may bring all equipment for your overnight stay.   Contacts, glasses, dentures or bridgework may not be worn into surgery.      For patients admitted to the hospital, discharge time will be determined by your treatment team.   Patients discharged the day of surgery will not be allowed to drive home, and someone needs to stay with them for 24 hours.    Special instructions:   [facility]- Preparing For Surgery  Before surgery, you can play an important role. Because skin is not sterile, your skin needs to be as free of germs as possible. You can reduce the number of germs on your skin by washing with CHG (chlorahexidine gluconate) Soap  before surgery.  CHG is an antiseptic cleaner which kills germs and bonds with the skin to continue killing germs even after washing.    Oral Hygiene is also important to reduce your risk of infection.  Remember - BRUSH YOUR TEETH THE MORNING OF SURGERY WITH YOUR REGULAR TOOTHPASTE  Please do not use if you have an allergy to CHG or antibacterial soaps. If your skin becomes reddened/irritated stop using the CHG.  Do not shave (including legs and underarms) for at least 48 hours prior to first CHG shower. It is OK to shave your face.  Please follow these instructions carefully.   1. Shower the NIGHT BEFORE SURGERY and the MORNING OF SURGERY with CHG Soap.   2. If you chose to wash your hair, wash your hair first as usual with your normal shampoo.  3. After you shampoo, rinse your hair and body thoroughly to remove the shampoo.  4. Use CHG as you would any other liquid soap. You can apply CHG directly to the skin and wash gently with a scrungie or a clean washcloth.   5. Apply the CHG Soap to your body ONLY FROM THE NECK  DOWN.  Do not use on open wounds or open sores. Avoid contact with your eyes, ears, mouth and genitals (private parts). Wash Face and genitals (private parts)  with your normal soap.   6. Wash thoroughly, paying special attention to the area where your surgery will be performed.  7. Thoroughly rinse your body with warm water from the neck down.  8. DO NOT shower/wash with your normal soap after using and rinsing off the CHG Soap.  9. Pat yourself dry with a CLEAN TOWEL.  10. Wear CLEAN PAJAMAS to bed the night before surgery  11. Place CLEAN SHEETS on your bed the night of your first shower and DO NOT SLEEP WITH PETS.   Day of Surgery: SHOWER Wear Clean/Comfortable clothing the morning of surgery Do not apply any deodorants/lotions.   Remember to brush your teeth WITH YOUR REGULAR TOOTHPASTE.   Please read over the following fact sheets that you were  given.

## 2019-12-01 NOTE — Progress Notes (Addendum)
PCP - Orene Desanctis Cardiologist - denies  PPM/ICD - denies  Chest x-ray - n/a EKG - 12/01/2019 (abnormal) Stress Test - denies ECHO - denies Cardiac Cath - denies  Sleep Study - denies  Patient instructed to hold all Aspirin, NSAID's, herbal medications, fish oil and vitamins 7 days prior to surgery.   ERAS Protcol -no   COVID TEST- 12/01/2019   Anesthesia review: yes, abnormal EKG (BBB)  Patient denies shortness of breath, fever, cough and chest pain at PAT appointment   All instructions explained to the patient, with a verbal understanding of the material. Patient agrees to go over the instructions while at home for a better understanding. Patient also instructed to self quarantine after being tested for COVID-19. The opportunity to ask questions was provided.

## 2019-12-02 NOTE — Progress Notes (Signed)
Anesthesia Chart Review:  Case: [id number] Date/Time: 12/03/19 1200   Procedures:      Left Lumbar 2-3 Lumbar 3-4 Lumbar 4-5 Anterior lateral lumbar interbody fusion with percutaneous pedicle screw fixation (Left ) - 3C     LUMBAR PERCUTANEOUS PEDICLE SCREW 3 LEVEL (N/A )   Anesthesia type: General   Pre-op diagnosis: Degenerative lumbar spinal stenosis   [facility]: [facility] ROOM 20 / [facility]   Surgeons: Maeola Harman, MD      DISCUSSION: Patient is a 59 year old male scheduled for the above procedure.  History includes smoking (4-5 cigarettes/month, vaping daily), hepatitis C (diagnosed 2016; s/p Harvoni), left THA (09/30/18). 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   Last evaluated by his PCP Orene Desanctis, MD on 11/30/19 for routine follow-up. Known coronary calcifications noted on chest CT dating back to at least 2019. She reviewed most recent chest CT from September. He was without chest pain, SOB, palpitations, syncope, orthopnea, edema, PND and "Exercises regularly". Continued statin therapy and regular exercise recommended. He was trying to cut back on smoking. She notes, "Stable. Upcoming surgery per neurosurgery." Eight month follow-up planned.   12/01/2019 presurgical COVID-19 test negative.  Anesthesia team to evaluate on the day of surgery.   VS: BP (!) 154/87   Pulse 84   Temp 36.4 C (Oral)   Resp 18   Ht 6\' 2"  (1.88 m)   Wt 101.4 kg   SpO2 99%   BMI 28.71 kg/m     PROVIDERS: , MD is PCP - He was last evaluated by Orene Desanctis, MD on 09/24/19 for abnormal SPEP and normocytic anemia. By notes, work-up revealed no monoclonal protein and reassurance given. Continue follow-up with PCP for anemia, otherwise PRN HEM-ONC follow-up.    LABS: Labs reviewed: Acceptable for surgery. A1c 5.3 on 07/23/19 ([facility]). (all labs ordered are listed, but only abnormal results are displayed)  Labs Reviewed  COMPREHENSIVE METABOLIC PANEL  - Abnormal; Notable for the following components:      Result Value   BUN 6 (*)    Total Protein 8.2 (*)    All other components within normal limits  SURGICAL PCR SCREEN  CBC  TYPE AND SCREEN     IMAGES: CT Chest (lung cancer screening) 09/26/19 ([facility]): Impression:  1. Decreased size of 4 mm left upper lobe nodule, now measuring no more  than 2 mm. No new or enlarging pulmonary nodules.  2. New small right pleural effusion with adjacent atelectasis.  3. Moderate coronary artery calcifications.  - Done at [facility]. One year follow-up recommended at 09/29/19 visit with 10/01/19, NP/Tong, Laren Boom, MD. PCP Kathie Rhodes, MD also reviewed results and continued statin therapy recommended.   EKG: 12/01/19: Normal sinus rhythm with sinus arrhythmia Incomplete right bundle branch block Borderline ECG Confirmed by 12/03/19 [id number]) on 12/01/2019 10:45:45 PM   CV: N/A  Past Medical History:  Diagnosis Date  . Hepatitis    hepatitis C (cleared by MD)    Past Surgical History:  Procedure Laterality Date  . MOHS SURGERY  03/2018  . TONSILLECTOMY     removed as a child  . TOTAL HIP ARTHROPLASTY Left 09/30/2018   Procedure: LEFT TOTAL HIP ARTHROPLASTY ANTERIOR APPROACH;  Surgeon: 10/02/2018, MD;  Location: [facility]S;  Service: Orthopedics;  Laterality: Left;    MEDICATIONS: . cyclobenzaprine (FLEXERIL) 10 MG tablet  . ferrous sulfate 325 (65 FE) MG EC  tablet  . gabapentin (NEURONTIN) 300 MG capsule  . ibuprofen (ADVIL) 200 MG tablet  . Misc Natural Products (GLUCOSAMINE CHOND COMPLEX/MSM PO)  . Multiple Vitamin (MULTIVITAMIN WITH MINERALS) TABS tablet  . Omega-3 Fatty Acids (FISH OIL) 1000 MG CAPS  . simvastatin (ZOCOR) 10 MG tablet  . vitamin E 180 MG (400 UNITS) capsule   No current facility-administered medications for this encounter.    Shonna Chock, PA-C Surgical Short Stay/Anesthesiology [facility] Phone [phone] [facility] Phone [phone] 12/02/2019 9:04 AM

## 2019-12-02 NOTE — Anesthesia Preprocedure Evaluation (Addendum)
Anesthesia Evaluation  Patient identified by MRN, date of birth, ID band Patient awake    Reviewed: Allergy & Precautions, NPO status , Patient's Chart, lab work & pertinent test results  Airway Mallampati: II  TM Distance: >3 FB     Dental   Pulmonary Current Smoker and Patient abstained from smoking.,    breath sounds clear to auscultation       Cardiovascular negative cardio ROS   Rhythm:Regular Rate:Normal     Neuro/Psych negative neurological ROS  negative psych ROS   GI/Hepatic negative GI ROS, (+) Hepatitis -  Endo/Other  negative endocrine ROS  Renal/GU negative Renal ROS     Musculoskeletal   Abdominal   Peds  Hematology  (+) anemia ,   Anesthesia Other Findings   Reproductive/Obstetrics                            Anesthesia Physical Anesthesia Plan  ASA: III  Anesthesia Plan: General   Post-op Pain Management:    Induction: Intravenous  PONV Risk Score and Plan: 2 and Ondansetron, Dexamethasone and Midazolam  Airway Management Planned: Oral ETT  Additional Equipment:   Intra-op Plan:   Post-operative Plan: Extubation in OR  Informed Consent:     Dental advisory given  Plan Discussed with: CRNA and Anesthesiologist  Anesthesia Plan Comments: (PAT note written 12/02/2019 by Shonna Chock, PA-C. )       Anesthesia Quick Evaluation

## 2019-12-03 ENCOUNTER — Inpatient Hospital Stay (HOSPITAL_COMMUNITY)
Admission: RE | Admit: 2019-12-03 | Discharge: 2019-12-04 | DRG: 460 | Disposition: A | Discharge status: Home / Self Care | Payer: Federal, State, Local not specified - PPO | Attending: Neurosurgery | Admitting: Neurosurgery

## 2019-12-03 ENCOUNTER — Inpatient Hospital Stay (HOSPITAL_COMMUNITY): Payer: Federal, State, Local not specified - PPO

## 2019-12-03 ENCOUNTER — Inpatient Hospital Stay (HOSPITAL_COMMUNITY): Payer: Federal, State, Local not specified - PPO | Admitting: Vascular Surgery

## 2019-12-03 ENCOUNTER — Inpatient Hospital Stay (HOSPITAL_COMMUNITY): Payer: Federal, State, Local not specified - PPO | Admitting: Certified Registered Nurse Anesthetist

## 2019-12-03 ENCOUNTER — Encounter (HOSPITAL_COMMUNITY): Payer: Self-pay | Admitting: Neurosurgery

## 2019-12-03 ENCOUNTER — Encounter (HOSPITAL_COMMUNITY)
Admission: RE | Disposition: A | Discharge status: Home / Self Care | Payer: Self-pay | Source: Home / Self Care | Attending: Neurosurgery

## 2019-12-03 ENCOUNTER — Other Ambulatory Visit: Payer: Self-pay

## 2019-12-03 DIAGNOSIS — M419 Scoliosis, unspecified: Secondary | ICD-10-CM | POA: Diagnosis present

## 2019-12-03 DIAGNOSIS — M5116 Intervertebral disc disorders with radiculopathy, lumbar region: Secondary | ICD-10-CM | POA: Diagnosis present

## 2019-12-03 DIAGNOSIS — G8929 Other chronic pain: Secondary | ICD-10-CM | POA: Diagnosis present

## 2019-12-03 DIAGNOSIS — Z20822 Contact with and (suspected) exposure to covid-19: Secondary | ICD-10-CM | POA: Diagnosis present

## 2019-12-03 DIAGNOSIS — M48061 Spinal stenosis, lumbar region without neurogenic claudication: Secondary | ICD-10-CM | POA: Diagnosis present

## 2019-12-03 DIAGNOSIS — F17210 Nicotine dependence, cigarettes, uncomplicated: Secondary | ICD-10-CM | POA: Diagnosis present

## 2019-12-03 DIAGNOSIS — M4126 Other idiopathic scoliosis, lumbar region: Secondary | ICD-10-CM | POA: Diagnosis present

## 2019-12-03 DIAGNOSIS — M4316 Spondylolisthesis, lumbar region: Secondary | ICD-10-CM | POA: Diagnosis present

## 2019-12-03 DIAGNOSIS — Z419 Encounter for procedure for purposes other than remedying health state, unspecified: Secondary | ICD-10-CM

## 2019-12-03 HISTORY — PX: LUMBAR PERCUTANEOUS PEDICLE SCREW 3 LEVEL: SHX5562

## 2019-12-03 HISTORY — PX: ANTERIOR LAT LUMBAR FUSION: SHX1168

## 2019-12-03 SURGERY — ANTERIOR LATERAL LUMBAR FUSION 3 LEVELS
Anesthesia: General

## 2019-12-03 MED ORDER — PHENYLEPHRINE HCL (PRESSORS) 10 MG/ML IV SOLN
INTRAVENOUS | Status: AC
  Filled 2019-12-03: qty 1

## 2019-12-03 MED ORDER — PHENOL 1.4 % MT LIQD: 1.0000 | OROMUCOSAL | Status: DC | PRN

## 2019-12-03 MED ORDER — VASOPRESSIN 20 UNIT/ML IV SOLN
INTRAVENOUS | Status: DC | PRN
  Administered 2019-12-03 (×2): 1 [IU] via INTRAVENOUS

## 2019-12-03 MED ORDER — ONDANSETRON HCL 4 MG/2ML IJ SOLN: 4.0000 mg | Freq: Four times a day (QID) | INTRAMUSCULAR | Status: DC | PRN

## 2019-12-03 MED ORDER — CHLORHEXIDINE GLUCONATE 0.12 % MT SOLN: 15.0000 mL | Freq: Once | OROMUCOSAL | Status: AC

## 2019-12-03 MED ORDER — METHOCARBAMOL 1000 MG/10ML IJ SOLN
500.0000 mg | Freq: Four times a day (QID) | INTRAVENOUS | Status: DC | PRN
  Filled 2019-12-03 (×2): qty 5

## 2019-12-03 MED ORDER — LACTATED RINGERS IV SOLN: INTRAVENOUS | Status: DC

## 2019-12-03 MED ORDER — FERROUS SULFATE 325 (65 FE) MG PO TABS
325.0000 mg | ORAL_TABLET | Freq: Three times a day (TID) | ORAL | Status: DC
  Administered 2019-12-04: 325 mg via ORAL
  Filled 2019-12-03: qty 1

## 2019-12-03 MED ORDER — BUPIVACAINE HCL (PF) 0.5 % IJ SOLN
INTRAMUSCULAR | Status: AC
  Filled 2019-12-03: qty 30

## 2019-12-03 MED ORDER — SUFENTANIL CITRATE 50 MCG/ML IV SOLN
INTRAVENOUS | Status: DC | PRN
Start: 2019-12-03 — End: 2019-12-03
  Administered 2019-12-03 (×3): 5 ug via INTRAVENOUS
  Administered 2019-12-03: 10 ug via INTRAVENOUS
  Administered 2019-12-03 (×2): 5 ug via INTRAVENOUS

## 2019-12-03 MED ORDER — SODIUM CHLORIDE 0.9% FLUSH: 3.0000 mL | INTRAVENOUS | Status: DC | PRN

## 2019-12-03 MED ORDER — CEFAZOLIN SODIUM-DEXTROSE 2-4 GM/100ML-% IV SOLN
2.0000 g | INTRAVENOUS | Status: AC
  Administered 2019-12-03: 2 g via INTRAVENOUS

## 2019-12-03 MED ORDER — HYDROMORPHONE HCL 1 MG/ML IJ SOLN
INTRAMUSCULAR | Status: AC
  Filled 2019-12-03: qty 1

## 2019-12-03 MED ORDER — EPHEDRINE SULFATE-NACL 50-0.9 MG/10ML-% IV SOSY
PREFILLED_SYRINGE | INTRAVENOUS | Status: DC | PRN
  Administered 2019-12-03: 15 mg via INTRAVENOUS
  Administered 2019-12-03 (×2): 10 mg via INTRAVENOUS
  Administered 2019-12-03: 15 mg via INTRAVENOUS

## 2019-12-03 MED ORDER — BUPIVACAINE LIPOSOME 1.3 % IJ SUSP
20.0000 mL | INTRAMUSCULAR | Status: AC
  Administered 2019-12-03: 20 mL
  Filled 2019-12-03: qty 20

## 2019-12-03 MED ORDER — MENTHOL 3 MG MT LOZG: 1.0000 | LOZENGE | OROMUCOSAL | Status: DC | PRN

## 2019-12-03 MED ORDER — METHOCARBAMOL 500 MG PO TABS
500.0000 mg | ORAL_TABLET | Freq: Four times a day (QID) | ORAL | Status: DC | PRN
  Administered 2019-12-03 – 2019-12-04 (×3): 500 mg via ORAL
  Filled 2019-12-03 (×3): qty 1

## 2019-12-03 MED ORDER — BUPIVACAINE HCL (PF) 0.5 % IJ SOLN
INTRAMUSCULAR | Status: DC | PRN
  Administered 2019-12-03 (×2): 10 mL

## 2019-12-03 MED ORDER — CEFAZOLIN SODIUM-DEXTROSE 2-4 GM/100ML-% IV SOLN
2.0000 g | Freq: Three times a day (TID) | INTRAVENOUS | Status: AC
  Administered 2019-12-03 – 2019-12-04 (×2): 2 g via INTRAVENOUS
  Filled 2019-12-03 (×2): qty 100

## 2019-12-03 MED ORDER — THROMBIN 5000 UNITS EX SOLR
OROMUCOSAL | Status: DC | PRN
  Administered 2019-12-03: 5 mL via TOPICAL

## 2019-12-03 MED ORDER — ONDANSETRON HCL 4 MG PO TABS: 4.0000 mg | ORAL_TABLET | Freq: Four times a day (QID) | ORAL | Status: DC | PRN

## 2019-12-03 MED ORDER — ORAL CARE MOUTH RINSE: 15.0000 mL | Freq: Once | OROMUCOSAL | Status: AC

## 2019-12-03 MED ORDER — FLEET ENEMA 7-19 GM/118ML RE ENEM: 1.0000 | ENEMA | Freq: Once | RECTAL | Status: DC | PRN

## 2019-12-03 MED ORDER — SODIUM CHLORIDE (PF) 0.9 % IJ SOLN
INTRAMUSCULAR | Status: AC
  Filled 2019-12-03: qty 10

## 2019-12-03 MED ORDER — ALBUMIN HUMAN 5 % IV SOLN: INTRAVENOUS | Status: DC | PRN

## 2019-12-03 MED ORDER — OXYCODONE HCL 5 MG PO TABS
5.0000 mg | ORAL_TABLET | ORAL | Status: DC | PRN
  Administered 2019-12-03 – 2019-12-04 (×4): 10 mg via ORAL
  Filled 2019-12-03 (×4): qty 2

## 2019-12-03 MED ORDER — ACETAMINOPHEN 325 MG PO TABS
650.0000 mg | ORAL_TABLET | ORAL | Status: DC | PRN
  Administered 2019-12-03: 650 mg via ORAL
  Filled 2019-12-03 (×2): qty 2

## 2019-12-03 MED ORDER — MIDAZOLAM HCL 5 MG/5ML IJ SOLN
INTRAMUSCULAR | Status: DC | PRN
  Administered 2019-12-03: 2 mg via INTRAVENOUS

## 2019-12-03 MED ORDER — CHLORHEXIDINE GLUCONATE CLOTH 2 % EX PADS: 6.0000 | MEDICATED_PAD | Freq: Once | CUTANEOUS | Status: DC

## 2019-12-03 MED ORDER — LIDOCAINE HCL (PF) 2 % IJ SOLN
INTRAMUSCULAR | Status: AC
  Filled 2019-12-03: qty 10

## 2019-12-03 MED ORDER — VASOPRESSIN 20 UNIT/ML IV SOLN
INTRAVENOUS | Status: AC
  Filled 2019-12-03: qty 1

## 2019-12-03 MED ORDER — LACTATED RINGERS IV SOLN: INTRAVENOUS | Status: DC | PRN

## 2019-12-03 MED ORDER — LIDOCAINE-EPINEPHRINE 1 %-1:100000 IJ SOLN
INTRAMUSCULAR | Status: AC
  Filled 2019-12-03: qty 1

## 2019-12-03 MED ORDER — HYDROCODONE-ACETAMINOPHEN 5-325 MG PO TABS: 2.0000 | ORAL_TABLET | ORAL | Status: DC | PRN

## 2019-12-03 MED ORDER — MIDAZOLAM HCL 2 MG/2ML IJ SOLN
INTRAMUSCULAR | Status: AC
  Filled 2019-12-03: qty 2

## 2019-12-03 MED ORDER — PROPOFOL 500 MG/50ML IV EMUL
INTRAVENOUS | Status: DC | PRN
  Administered 2019-12-03: 50 ug/kg/min via INTRAVENOUS

## 2019-12-03 MED ORDER — VITAMIN E 45 MG (100 UNIT) PO CAPS
400.0000 [IU] | ORAL_CAPSULE | Freq: Every day | ORAL | Status: DC
  Administered 2019-12-04: 400 [IU] via ORAL
  Filled 2019-12-03 (×2): qty 4

## 2019-12-03 MED ORDER — DEXAMETHASONE SODIUM PHOSPHATE 10 MG/ML IJ SOLN
INTRAMUSCULAR | Status: AC
  Filled 2019-12-03: qty 1

## 2019-12-03 MED ORDER — SUFENTANIL CITRATE 50 MCG/ML IV SOLN
INTRAVENOUS | Status: AC
  Filled 2019-12-03: qty 1

## 2019-12-03 MED ORDER — SODIUM CHLORIDE (PF) 0.9 % IJ SOLN
INTRAMUSCULAR | Status: DC | PRN
  Administered 2019-12-03 (×2): 10 mL

## 2019-12-03 MED ORDER — ZOLPIDEM TARTRATE 5 MG PO TABS: 5.0000 mg | ORAL_TABLET | Freq: Every evening | ORAL | Status: DC | PRN

## 2019-12-03 MED ORDER — HYDROMORPHONE HCL 1 MG/ML IJ SOLN
0.2500 mg | INTRAMUSCULAR | Status: DC | PRN
  Administered 2019-12-03 (×2): 0.5 mg via INTRAVENOUS

## 2019-12-03 MED ORDER — THROMBIN 5000 UNITS EX SOLR
CUTANEOUS | Status: AC
  Filled 2019-12-03: qty 5000

## 2019-12-03 MED ORDER — ALUM & MAG HYDROXIDE-SIMETH 200-200-20 MG/5ML PO SUSP: 30.0000 mL | Freq: Four times a day (QID) | ORAL | Status: DC | PRN

## 2019-12-03 MED ORDER — OMEGA-3-ACID ETHYL ESTERS 1 G PO CAPS
1.0000 g | ORAL_CAPSULE | Freq: Every day | ORAL | Status: DC
  Administered 2019-12-04: 1 g via ORAL
  Filled 2019-12-03: qty 1

## 2019-12-03 MED ORDER — PANTOPRAZOLE SODIUM 40 MG IV SOLR: 40.0000 mg | Freq: Every day | INTRAVENOUS | Status: DC

## 2019-12-03 MED ORDER — ADULT MULTIVITAMIN W/MINERALS CH
1.0000 | ORAL_TABLET | Freq: Every day | ORAL | Status: DC
  Administered 2019-12-04: 1 via ORAL
  Filled 2019-12-03: qty 1

## 2019-12-03 MED ORDER — CYCLOBENZAPRINE HCL 10 MG PO TABS: 10.0000 mg | ORAL_TABLET | Freq: Three times a day (TID) | ORAL | Status: DC | PRN

## 2019-12-03 MED ORDER — SUCCINYLCHOLINE CHLORIDE 200 MG/10ML IV SOSY
PREFILLED_SYRINGE | INTRAVENOUS | Status: DC | PRN
  Administered 2019-12-03: 140 mg via INTRAVENOUS

## 2019-12-03 MED ORDER — HYDROMORPHONE HCL 1 MG/ML IJ SOLN: 0.5000 mg | INTRAMUSCULAR | Status: DC | PRN

## 2019-12-03 MED ORDER — PROPOFOL 10 MG/ML IV BOLUS
INTRAVENOUS | Status: AC
  Filled 2019-12-03: qty 20

## 2019-12-03 MED ORDER — CHLORHEXIDINE GLUCONATE 0.12 % MT SOLN
OROMUCOSAL | Status: AC
  Administered 2019-12-03: 15 mL via OROMUCOSAL
  Filled 2019-12-03: qty 15

## 2019-12-03 MED ORDER — PROPOFOL 10 MG/ML IV BOLUS
INTRAVENOUS | Status: DC | PRN
  Administered 2019-12-03: 150 mg via INTRAVENOUS
  Administered 2019-12-03: 50 mg via INTRAVENOUS

## 2019-12-03 MED ORDER — DOCUSATE SODIUM 100 MG PO CAPS
100.0000 mg | ORAL_CAPSULE | Freq: Two times a day (BID) | ORAL | Status: DC
  Administered 2019-12-03 – 2019-12-04 (×2): 100 mg via ORAL
  Filled 2019-12-03 (×2): qty 1

## 2019-12-03 MED ORDER — ONDANSETRON HCL 4 MG/2ML IJ SOLN
INTRAMUSCULAR | Status: AC
  Filled 2019-12-03: qty 2

## 2019-12-03 MED ORDER — LIDOCAINE 2% (20 MG/ML) 5 ML SYRINGE
INTRAMUSCULAR | Status: DC | PRN
  Administered 2019-12-03: 100 mg via INTRAVENOUS

## 2019-12-03 MED ORDER — KCL IN DEXTROSE-NACL 20-5-0.45 MEQ/L-%-% IV SOLN: INTRAVENOUS | Status: DC

## 2019-12-03 MED ORDER — HYDROMORPHONE HCL 1 MG/ML IJ SOLN
0.5000 mg | INTRAMUSCULAR | Status: DC | PRN
  Administered 2019-12-03 – 2019-12-04 (×2): 1 mg via INTRAVENOUS
  Filled 2019-12-03 (×2): qty 1

## 2019-12-03 MED ORDER — POLYETHYLENE GLYCOL 3350 17 G PO PACK: 17.0000 g | PACK | Freq: Every day | ORAL | Status: DC | PRN

## 2019-12-03 MED ORDER — LIDOCAINE-EPINEPHRINE 1 %-1:100000 IJ SOLN
INTRAMUSCULAR | Status: DC | PRN
  Administered 2019-12-03: 10 mL
  Administered 2019-12-03: 33 mL

## 2019-12-03 MED ORDER — HYDROMORPHONE HCL 1 MG/ML IJ SOLN
0.2500 mg | INTRAMUSCULAR | Status: DC | PRN
  Administered 2019-12-03: 0.5 mg via INTRAVENOUS

## 2019-12-03 MED ORDER — CEFAZOLIN SODIUM-DEXTROSE 2-4 GM/100ML-% IV SOLN
INTRAVENOUS | Status: AC
  Filled 2019-12-03: qty 100

## 2019-12-03 MED ORDER — SIMVASTATIN 20 MG PO TABS
10.0000 mg | ORAL_TABLET | Freq: Every day | ORAL | Status: DC
  Administered 2019-12-03: 10 mg via ORAL
  Filled 2019-12-03: qty 1

## 2019-12-03 MED ORDER — ROCURONIUM BROMIDE 10 MG/ML (PF) SYRINGE
PREFILLED_SYRINGE | INTRAVENOUS | Status: AC
  Filled 2019-12-03: qty 10

## 2019-12-03 MED ORDER — GABAPENTIN 100 MG PO CAPS
100.0000 mg | ORAL_CAPSULE | Freq: Three times a day (TID) | ORAL | Status: DC | PRN
  Filled 2019-12-03: qty 1

## 2019-12-03 MED ORDER — PANTOPRAZOLE SODIUM 40 MG PO TBEC
40.0000 mg | DELAYED_RELEASE_TABLET | Freq: Every day | ORAL | Status: DC
  Administered 2019-12-03 – 2019-12-04 (×2): 40 mg via ORAL
  Filled 2019-12-03 (×2): qty 1

## 2019-12-03 MED ORDER — SODIUM CHLORIDE 0.9% FLUSH: 3.0000 mL | Freq: Two times a day (BID) | INTRAVENOUS | Status: DC

## 2019-12-03 MED ORDER — ACETAMINOPHEN 650 MG RE SUPP: 650.0000 mg | RECTAL | Status: DC | PRN

## 2019-12-03 MED ORDER — DEXAMETHASONE SODIUM PHOSPHATE 10 MG/ML IJ SOLN
INTRAMUSCULAR | Status: DC | PRN
  Administered 2019-12-03: 10 mg via INTRAVENOUS

## 2019-12-03 MED ORDER — ONDANSETRON HCL 4 MG/2ML IJ SOLN
INTRAMUSCULAR | Status: DC | PRN
  Administered 2019-12-03: 4 mg via INTRAVENOUS

## 2019-12-03 MED ORDER — PHENYLEPHRINE HCL-NACL 10-0.9 MG/250ML-% IV SOLN
INTRAVENOUS | Status: DC | PRN
  Administered 2019-12-03: 60 ug/min via INTRAVENOUS

## 2019-12-03 MED ORDER — BISACODYL 10 MG RE SUPP: 10.0000 mg | Freq: Every day | RECTAL | Status: DC | PRN

## 2019-12-03 MED ORDER — 0.9 % SODIUM CHLORIDE (POUR BTL) OPTIME
TOPICAL | Status: DC | PRN
  Administered 2019-12-03: 1000 mL

## 2019-12-03 MED ORDER — SODIUM CHLORIDE 0.9 % IV SOLN: 250.0000 mL | INTRAVENOUS | Status: DC

## 2019-12-03 MED ORDER — OXYCODONE HCL 5 MG PO TABS: 5.0000 mg | ORAL_TABLET | ORAL | Status: DC | PRN

## 2019-12-03 SURGICAL SUPPLY — 82 items
BLADE CLIPPER SURG (BLADE) IMPLANT
CARTRIDGE OIL MAESTRO DRILL (MISCELLANEOUS) IMPLANT
CLIP NEUROVISION LG (CLIP) ×4 IMPLANT
CNTNR URN SCR LID CUP LEK RST (MISCELLANEOUS) ×2 IMPLANT
CONT SPEC 4OZ STRL OR WHT (MISCELLANEOUS) ×2
COVER BACK TABLE 24X17X13 BIG (DRAPES) ×4 IMPLANT
COVER BACK TABLE 60X90IN (DRAPES) ×4 IMPLANT
COVER WAND RF STERILE (DRAPES) ×8 IMPLANT
DECANTER SPIKE VIAL GLASS SM (MISCELLANEOUS) ×12 IMPLANT
DERMABOND ADVANCED (GAUZE/BANDAGES/DRESSINGS) ×6
DERMABOND ADVANCED .7 DNX12 (GAUZE/BANDAGES/DRESSINGS) ×6 IMPLANT
DIFFUSER DRILL AIR PNEUMATIC (MISCELLANEOUS) ×8 IMPLANT
DRAPE C-ARM 42X72 X-RAY (DRAPES) ×8 IMPLANT
DRAPE C-ARMOR (DRAPES) ×8 IMPLANT
DRAPE LAPAROTOMY 100X72X124 (DRAPES) ×8 IMPLANT
DRAPE SURG 17X23 STRL (DRAPES) ×4 IMPLANT
DRSG OPSITE POSTOP 3X4 (GAUZE/BANDAGES/DRESSINGS) ×4 IMPLANT
DRSG OPSITE POSTOP 4X10 (GAUZE/BANDAGES/DRESSINGS) ×4 IMPLANT
DRSG OPSITE POSTOP 4X6 (GAUZE/BANDAGES/DRESSINGS) ×4 IMPLANT
DURAPREP 26ML APPLICATOR (WOUND CARE) ×8 IMPLANT
ELECT BLADE 4.0 EZ CLEAN MEGAD (MISCELLANEOUS) ×4
ELECT REM PT RETURN 9FT ADLT (ELECTROSURGICAL) ×8
ELECTRODE BLDE 4.0 EZ CLN MEGD (MISCELLANEOUS) ×2 IMPLANT
ELECTRODE REM PT RTRN 9FT ADLT (ELECTROSURGICAL) ×4 IMPLANT
GAUZE 4X4 16PLY RFD (DISPOSABLE) ×12 IMPLANT
GAUZE SPONGE 4X4 12PLY STRL (GAUZE/BANDAGES/DRESSINGS) ×4 IMPLANT
GLOVE BIO SURGEON STRL SZ7 (GLOVE) ×4 IMPLANT
GLOVE BIO SURGEON STRL SZ8 (GLOVE) ×12 IMPLANT
GLOVE BIOGEL PI IND STRL 7.5 (GLOVE) ×6 IMPLANT
GLOVE BIOGEL PI IND STRL 8 (GLOVE) ×4 IMPLANT
GLOVE BIOGEL PI IND STRL 8.5 (GLOVE) ×8 IMPLANT
GLOVE BIOGEL PI INDICATOR 7.5 (GLOVE) ×6
GLOVE BIOGEL PI INDICATOR 8 (GLOVE) ×4
GLOVE BIOGEL PI INDICATOR 8.5 (GLOVE) ×8
GLOVE ECLIPSE 8.0 STRL XLNG CF (GLOVE) ×8 IMPLANT
GLOVE EXAM NITRILE XL STR (GLOVE) IMPLANT
GLOVE SURG SS PI 7.5 STRL IVOR (GLOVE) ×16 IMPLANT
GLOVE SURG SS PI 8.0 STRL IVOR (GLOVE) ×8 IMPLANT
GOWN STRL REUS W/ TWL LRG LVL3 (GOWN DISPOSABLE) ×2 IMPLANT
GOWN STRL REUS W/ TWL XL LVL3 (GOWN DISPOSABLE) ×8 IMPLANT
GOWN STRL REUS W/TWL 2XL LVL3 (GOWN DISPOSABLE) ×8 IMPLANT
GOWN STRL REUS W/TWL LRG LVL3 (GOWN DISPOSABLE) ×2
GOWN STRL REUS W/TWL XL LVL3 (GOWN DISPOSABLE) ×8
GUIDEWIRE NITINOL BEVEL TIP (WIRE) ×32 IMPLANT
HEMOSTAT POWDER SURGIFOAM 1G (HEMOSTASIS) ×4 IMPLANT
KIT BASIN OR (CUSTOM PROCEDURE TRAY) ×4 IMPLANT
KIT DILATOR XLIF 5 (KITS) ×2 IMPLANT
KIT INFUSE SMALL (Orthopedic Implant) ×4 IMPLANT
KIT POSITION SURG JACKSON T1 (MISCELLANEOUS) ×4 IMPLANT
KIT SURGICAL ACCESS MAXCESS 4 (KITS) ×4 IMPLANT
KIT TURNOVER KIT B (KITS) ×8 IMPLANT
KIT XLIF (KITS) ×2
MARKER SKIN DUAL TIP RULER LAB (MISCELLANEOUS) ×4 IMPLANT
MODULE NVM5 NEXT GEN EMG (NEEDLE) ×4 IMPLANT
MODULUS XLW 10X22X55MM 10 (Spine Construct) ×16 IMPLANT
MODULUS XLW 12X22X60MM 10 (Spine Construct) ×8 IMPLANT
NEEDLE HYPO 21X1.5 SAFETY (NEEDLE) ×4 IMPLANT
NEEDLE HYPO 25X1 1.5 SAFETY (NEEDLE) ×8 IMPLANT
NEEDLE I PASS (NEEDLE) ×4 IMPLANT
NS IRRIG 1000ML POUR BTL (IV SOLUTION) ×4 IMPLANT
OIL CARTRIDGE MAESTRO DRILL (MISCELLANEOUS)
PACK LAMINECTOMY NEURO (CUSTOM PROCEDURE TRAY) ×8 IMPLANT
PAD ARMBOARD 7.5X6 YLW CONV (MISCELLANEOUS) IMPLANT
PATTIES SURGICAL .5 X.5 (GAUZE/BANDAGES/DRESSINGS) IMPLANT
PATTIES SURGICAL .5 X1 (DISPOSABLE) IMPLANT
PATTIES SURGICAL 1X1 (DISPOSABLE) IMPLANT
PUTTY BONE ATTRAX 10CC STRIP (Putty) ×8 IMPLANT
ROD RELINE MAS LORD 5.5X120MM (Rod) ×8 IMPLANT
SCREW LOCK RELINE 5.5 TULIP (Screw) ×32 IMPLANT
SCREW MAS RELINE 6.5X50 POLY (Screw) ×32 IMPLANT
SPONGE LAP 4X18 RFD (DISPOSABLE) IMPLANT
STAPLER SKIN PROX WIDE 3.9 (STAPLE) ×4 IMPLANT
SUT VIC AB 1 CT1 18XBRD ANBCTR (SUTURE) ×6 IMPLANT
SUT VIC AB 1 CT1 8-18 (SUTURE) ×6
SUT VIC AB 2-0 CT1 18 (SUTURE) ×12 IMPLANT
SUT VIC AB 3-0 SH 8-18 (SUTURE) ×16 IMPLANT
SYR 20ML LL LF (SYRINGE) ×8 IMPLANT
SYR TB 1ML 25GX5/8 (SYRINGE) IMPLANT
TOWEL GREEN STERILE (TOWEL DISPOSABLE) ×4 IMPLANT
TOWEL GREEN STERILE FF (TOWEL DISPOSABLE) ×4 IMPLANT
TRAY FOLEY MTR SLVR 16FR STAT (SET/KITS/TRAYS/PACK) ×4 IMPLANT
WATER STERILE IRR 1000ML POUR (IV SOLUTION) ×4 IMPLANT

## 2019-12-03 NOTE — Progress Notes (Signed)
Orthopedic Tech Progress Note Patient Details:  Jonathan Stephens 1960/03/05 [id number] Patient has brace Patient ID: Jonathan Stephens, male   DOB: 01-30-1960, 59 y.o.   MRN: [id number]   Lovett Calender 12/03/2019, 6:11 PM

## 2019-12-03 NOTE — H&P (Signed)
Patient ID:   [id number] Patient: Jonathan Stephens  Date of Birth: [birth year] Visit Type: Office Visit   Date: 10/14/2019 10:45 AM Provider: Danae Orleans. Venetia Maxon MD   This 59 year old male presents for MRI Review.  HISTORY OF PRESENT ILLNESS: 1.  MRI Review  Patient returns to review his MRI and x-rays.  I reviewed the patient's MRI and x-rays with him.  These show severe foraminal stenosis at the L2-3, L3-4, L4-5 levels, worse on the right than the left.  There is grade 1 spondylolisthesis of L4 on L5.  The patient is continuing to complain of significant low back right leg pain.  He is currently grading his pain at 6 out 10 in severity.  This makes says considering his lumbar images including MRI and x-rays along with scoliosis radiographs which show concavity of curvature L2 through L5 levels  Based on the patient's severe significant persistent pain nerve root compression, I have recommended proceeding with left-sided XL IF with L2-3, L3-4, L4-5 interbody grafts and percutaneous pedicle screw fixation.  Because of the orientation of the L4-5 level, it will be better to surgically  approach this level from the left from right      Medical/Surgical/Interim History Reviewed, no change.  Last detailed document date:08/04/2018.     PAST MEDICAL HISTORY, SURGICAL HISTORY, FAMILY HISTORY, SOCIAL HISTORY AND REVIEW OF SYSTEMS I have reviewed the patient's past medical, surgical, family and social history as well as the comprehensive review of systems as included on the [facility] history form dated 08/04/2018, which I have signed.  Family History: Reviewed, no changes.  Last detailed document date:08/04/2018.   Social History: Reviewed, no changes. Last detailed document date: 08/04/2018.    MEDICATIONS: (added, continued or stopped this visit) Started Medication Directions Instruction Stopped  Chantix 1 mg tablet take 1 tablet by oral route 2  times every day with glass of water after meals    cyclobenzaprine 10 mg tablet take 1 tablet by oral route 2 times every day    diclofenac sodium 75 mg tablet,delayed release 1 po bid    Flonase Allergy Relief 50 mcg/actuation nasal spray,suspension spray 1 - 2 spray by intranasal route  every day in each nostril as needed    gabapentin 300 mg capsule take 1 capsule by oral route 3 times every day      ALLERGIES: Ingredient Reaction Medication Name Comment NO KNOWN ALLERGIES    No known allergies. Reviewed, no changes.    PHYSICAL EXAM:  Vitals Date Temp F BP Pulse Ht In Wt Lb BMI BSA Pain Score 10/14/2019  158/92 84 74 218.6 28.07  6/10     IMPRESSION:  Severe stenosis and nerve root compression with right leg pain and weakness with scoliosis  PLAN: XL IF L2-3, L3-4, L4-5 levels with left-sided approach and with percutaneous pedicle screw fixation.  Risks and benefits of surgery were discussed in detail with patient and preoperative education was performed.  He was fitted for a back brace.  Surgery has been scheduled for December 2nd at [facility]  Orders: Diagnostic Procedures: Assessment Procedure M54.16 Lumbar Spine- AP/Lat Miscellaneous: Assessment  M43.16 LSO Brace  Assessment/Plan  # Detail Type Description  1. Assessment Radiculopathy, lumbar region (M54.16).     2. Assessment DDD (degenerative disc disease), lumbar (M51.36).     3. Assessment Idiopathic scoliosis of lumbar region (M41.26).     4. 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  5. Assessment Other chronic pain (G89.29).     6. Assessment Spondylolisthesis, lumbar region (M43.16).  Plan Orders LSO Brace.     7. Assessment Degenerative lumbar spinal stenosis (M48.061).     8. Assessment Disc displacement, lumbar (M51.26).                   Provider:  Danae OrleansJoseph  D. Venetia MaxonStern MD  10/15/2019 01:54 PM    Dictation edited by: Danae OrleansJoseph D. Venetia MaxonStern    CC Providers: Maeola HarmanJoseph Laycee Fitzsimmons MD  [address] [place], [place] [id number]               Electronically signed by Danae OrleansJoseph D. Venetia MaxonStern MD on 10/15/2019 01:54 PM Patient ID:   [id number] Patient: Jonathan Stephens  Date of Birth: 28-Dec-1960 Visit Type: Office Visit   Date: 08/04/2018 01:00 PM Provider: Danae OrleansJoseph D. Venetia MaxonStern MD   This 59 year old male presents for back and hip pain.  HISTORY OF PRESENT ILLNESS: 1.  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  Patient reports left hip pain radiating to the left foot with numbness and tingling and weakness in the left leg for more than 1 year.  He recalls no injury.  Medications prescribed by [place] primary care physician in [place] in [place] : Gabapentin 300 mg t.i.d. Flexeril 10 mg b.i.d. Diclofenac 75 mg not refilled Ibuprofen 800 mg usually q.i.d.  Epidural injections offered no lasting relief  History:  Tinnitus, alcohol abuse hx, recovery since 1997 Surgical history:  None  Images uploaded [place]  The patient is complaining of left leg pain.  The patient is also complaining of low back pain.  He notes weakness in his left leg.  He denies any right leg pain.  I reviewed his records from [facility] from Dr. Leonette MonarchMendoza.  The previous recommendation had made him to undergo L4-5 and L5-S1 decompression and fusion with multilevel decompressive surgery.  The patient describes that he can not walk on his left leg.  He does have L2-3 degeneration and L 4 5 spondylolisthesis.  On examination today the patient has exquisite tenderness in his left hip with a markedly positive Patrick's test.  This resulted in E obtaining a radiograph of his lumbar spine and also of his left hip.  The lumbar imaging is stable.  His left hip shows severe  degeneration.       PAST MEDICAL/SURGICAL HISTORY:   (Detailed)      PAST MEDICAL HISTORY, SURGICAL HISTORY, FAMILY HISTORY, SOCIAL HISTORY AND REVIEW OF SYSTEMS I have reviewed the patient's past medical, surgical, family and social history as well as the comprehensive review of systems as included on the [facility] history form dated 08/04/2018, which I have signed.  Family History:  (Detailed)   Social History:  (Detailed) Tobacco use reviewed. Preferred language is [place].   Tobacco use status: Cigarette smoker. Smoking status: Current every day smoker.  SMOKING STATUS Type Smoking Status Usage Per Day Years Used Total Pack Years Cigarette Current every day smoker     TOBACCO CESSATION INFORMATION Date Counseled By Order Status Description Code Tobacco Cessation Information 08/04/2018 Autumn Pattyebecca R. Martin RMA Tobacco cessation counseling completed   Smoking cessation education  TOBACCO/VAPING EXPOSURE No passive vaping exposure. No passive smoke exposure.       MEDICATIONS: (added, continued or stopped this visit) Started Medication Directions Instruction Stopped  Chantix 1  mg tablet take 1 tablet by oral route 2 times every day with glass of water after meals    cyclobenzaprine 10 mg tablet take 1 tablet by oral route 2 times every day    diclofenac sodium 75 mg tablet,delayed release 1 po bid    Flonase Allergy Relief 50 mcg/actuation nasal spray,suspension spray 1 - 2 spray by intranasal route  every day in each nostril as needed    gabapentin 300 mg capsule take 1 capsule by oral route 3 times every day      ALLERGIES: Ingredient Reaction Medication Name Comment NO KNOWN ALLERGIES    No known allergies.   REVIEW OF SYSTEMS  See scanned patient registration form, dated 08/04/2018, signed and dated on 08/04/2018  Review of Systems  Details System Neg/Pos Details Constitutional Negative Chills, Fatigue, Fever, Malaise, Night sweats, Weight gain and Weight loss. ENMT Negative Ear drainage, Hearing loss, Nasal drainage, Otalgia, Sinus pressure and Sore throat. Eyes Negative Eye discharge, Eye pain and Vision changes. Respiratory Negative Chronic cough, Cough, Dyspnea, Known TB exposure and Wheezing. Cardio Negative Chest pain, Claudication, Edema and Irregular heartbeat/palpitations. GI Negative Abdominal pain, Blood in stool, Change in stool pattern, Constipation, Decreased appetite, Diarrhea, Heartburn, Nausea and Vomiting. GU Negative Dribbling, Dysuria, Erectile dysfunction, Hematuria, Polyuria (Genitourinary), Slow stream, Urinary frequency, Urinary incontinence and Urinary retention. Endocrine Negative Cold intolerance, Heat intolerance, Polydipsia and Polyphagia. Neuro Positive Extremity weakness, Gait disturbance, Numbness in extremity. Psych Negative Anxiety, Depression and Insomnia. Integumentary Negative Brittle hair, Brittle nails, Change in shape/size of mole(s), Hair loss, Hirsutism, Hives, Pruritus, Rash and Skin lesion. MS Positive Back pain. Hema/Lymph Negative Easy bleeding, Easy bruising and Lymphadenopathy. Allergic/Immuno Negative Contact allergy, Environmental allergies, Food allergies and Seasonal allergies. Reproductive Negative Penile discharge and Sexual dysfunction.  PHYSICAL EXAM:  Vitals Date Temp F BP Pulse Ht In Wt Lb BMI BSA Pain Score 08/04/2018 97.4 145/85 87 74 222 28.5  7/10   PHYSICAL EXAM Details General Level of Distress: no acute distress Overall Appearance: normal  Head and Face  Right Left  Fundoscopic Exam:  normal normal    Cardiovascular Cardiac: regular rate and rhythm without murmur  Right Left  Carotid Pulses: normal normal  Respiratory Lungs: clear to auscultation  Neurological Orientation: normal Recent  and Remote Memory: normal Attention Span and Concentration:   normal Language: normal Fund of Knowledge: normal  Right Left Sensation: normal normal Upper Extremity Coordination: normal normal  Lower Extremity Coordination: normal normal  Musculoskeletal Gait and Station: normal  Right Left Upper Extremity Muscle Strength: normal normal Lower Extremity Muscle Strength: normal normal Upper Extremity Muscle Tone:  normal normal Lower Extremity Muscle Tone: normal normal   Motor Strength Upper and lower extremity motor strength was tested in the clinically pertinent muscles. Any abnormal findings will be noted below.   Right Left Hip Flexor:  4/5   Deep Tendon Reflexes  Right Left Biceps: normal normal Triceps: normal normal Brachioradialis: normal normal Patellar: normal normal Achilles: normal normal  Sensory Sensation was tested at L1 to S1.   Cranial Nerves II. Optic Nerve/Visual Fields: normal III. Oculomotor: normal IV. Trochlear: normal V. Trigeminal: normal VI. Abducens: normal VII. Facial: normal VIII. Acoustic/Vestibular: normal IX. Glossopharyngeal: normal X. Vagus: normal XI. Spinal Accessory: normal XII. Hypoglossal: normal  Motor and other Tests Lhermittes: negative Rhomberg: negative Pronator drift: absent     Right Left Hoffman's: normal normal Clonus: normal normal Babinski: normal normal SLR: negative positive at 60 degrees Patrick's Pearlean Brownie): negative positive Toe  Walk: normal normal Toe Lift: normal normal Heel Walk: normal normal SI Joint: nontender nontender   Additional Findings:  Patient is unable to walk on his left leg.  He is able to stand on his heels and toes and is able to bend touch his toes.  He has weakness in his left hip flexors on the left and also appears to have decreased temperature in his left foot.  He complains of left groin pain.    IMPRESSION:  While the patient has significant lumbar degeneration, I  believe the primary driver for his pain at this point is his left hip pathology.  I think he would likely benefit from a left hip replacement.  I would recommend holding off on lumbar spinal surgery at the present time.  PLAN: I have reassured the patient to Dr. Jerl Santos or for consideration of left hip replacement.  He will follow-up with me on an as-needed basis in the future  Orders: Diagnostic Procedures: Assessment Procedure M25.552 Hip - Left Lateral W/AP Pelvis M54.16 Lumbar Spine- AP/Lat/Flex/Ex Instruction(s)/Education: Assessment Instruction  Tobacco cessation counseling R03.0 Hypertension education Z68.28 Lifestyle education regarding diet  Completed Orders (this encounter) Order Details Reason Side Interpretation Result Initial Treatment Date Region Tobacco cessation counseling        Hypertension education Patient will monitor and contact primary care physician if needed.       Lifestyle education regarding diet Encouraged patient to eat well balanced diet.       Lumbar Spine- AP/Lat/Flex/Ex 1 of 2     08/04/2018 All Levels to All Levels Hip - Left Lateral W/AP Pelvis 2 of 2     08/04/2018   Assessment/Plan  # Detail Type Description  1. Assessment Spondylolisthesis, lumbar region (M43.16).     2. Assessment Disc displacement, lumbar (M51.26).     3. Assessment Left hip pain (M25.552).  Plan Orders Orthopedics Referral to [facility] for eval for left hip replacement. Clinical information/comments: Please contact the patient to schedule.     4. Assessment Lumbar radiculopathy (M54.16).     5. Assessment Body mass index (BMI) 28.0-28.9, adult (I34.74).  Plan Orders Today's instructions / counseling include(s) Lifestyle education regarding diet. Clinical information/comments: Encouraged patient to eat well balanced diet.     6. Assessment Elevated blood-pressure reading, w/o diagnosis of  htn (R03.0).       Pain Management Plan Pain Scale: 7/10. Method: Numeric Pain Intensity Scale. Location: back. Onset: 05/21/2018. Duration: varies. Quality: discomforting. Pain management follow-up plan of care: Patient will continue medication management..              Provider:  Danae Orleans. Venetia Maxon MD  08/10/2018 01:24 PM    Dictation edited by: Danae Orleans. Venetia Maxon    CC Providers: Maeola Harman MD  [address] [place], [place] [address]               Electronically signed by Danae Orleans. Venetia Maxon MD on 08/10/2018 01:24 PM

## 2019-12-03 NOTE — Anesthesia Procedure Notes (Signed)
Procedure Name: Intubation Performed by: Neysa Arts H, CRNA Pre-anesthesia Checklist: Patient identified, Emergency Drugs available, Suction available and Patient being monitored Patient Re-evaluated:Patient Re-evaluated prior to induction Oxygen Delivery Method: Circle System Utilized Preoxygenation: Pre-oxygenation with 100% oxygen Induction Type: IV induction Ventilation: Mask ventilation without difficulty Laryngoscope Size: Miller and 2 Grade View: Grade I Tube type: Oral Tube size: 7.5 mm Number of attempts: 1 Airway Equipment and Method: Stylet and Oral airway Placement Confirmation: ETT inserted through vocal cords under direct vision,  positive ETCO2 and breath sounds checked- equal and bilateral Secured at: 23 cm Tube secured with: Tape Dental Injury: Teeth and Oropharynx as per pre-operative assessment        

## 2019-12-03 NOTE — Brief Op Note (Signed)
12/03/2019  6:03 PM  PATIENT:  Jonathan Stephens  59 y.o. male  PRE-OPERATIVE DIAGNOSIS:  Degenerative lumbar spinal stenosis, lumbar scoliosis, lumbar spondylolisthesis, lumbar foraminal stenosis, lumbago, lumbar radiculopathy  POST-OPERATIVE DIAGNOSIS:  Degenerative lumbar spinal stenosis, lumbar scoliosis, lumbar spondylolisthesis, lumbar foraminal stenosis, lumbago, lumbar radiculopathy   PROCEDURE:  Procedure(s): Left Lumbar two-threeLumbar three-four Lumbar four-five Anterior lateral lumbar interbody fusion with percutaneous pedicle screw fixation (Left) LUMBAR PERCUTANEOUS PEDICLE SCREW Fixation Lumbar Two-Three/Lumbar Three-Four/ Lumbar Four-Five (N/A)  SURGEON:  Surgeon(s) and Role:    Maeola Harman, MD - Primary  PHYSICIAN ASSISTANT: Julien Girt, NP  ASSISTANTS: Poteat, RN   Hendren, MS3   ANESTHESIA:   general  EBL:  200 mL   BLOOD ADMINISTERED:none  DRAINS: none   LOCAL MEDICATIONS USED:  MARCAINE    and LIDOCAINE   SPECIMEN:  No Specimen  DISPOSITION OF SPECIMEN:  N/A  COUNTS:  YES  TOURNIQUET:  * No tourniquets in log *  DICTATION: Patient is a 59 year old with severe spondylosis stenosis and scoliosis of the lumbar spine. It was elected to take him to surgery for anterolateral decompression and posterior pedicle screw fixation at the L 23, L 34, L 45 levels.  Procedure: Patient was brought to the operating room and placed in a left lateral decubitus position on the operative table and using orthogonally projected C-arm fluoroscopy the patient was placed so that the L2-3 L3-4 and L 45 levels were visualized in AP and lateral plane. The patient was then taped into position. The table was flexed so as to expose the L 45 level as the patient has a high iliac crest. Skin was marked along with a posterior finger dissection incision. His flank was then prepped and draped in usual sterile fashion and incisions were made sequentially at L 45,  L3-4 and L2-3 levels.  Posterior finger dissection was made to enter the retroperitoneal space and then subsequently the probe was inserted into the psoas muscle from the left side initially at the L 45 level. After mapping the neural elements were able to dock the probe per the midpoint of this vertebral level and without indications electrically of too close proximity to the neural tissues. Subsequently the self-retaining tractor was.after sequential dilators were utilized the shim was employed and the interspace was cleared of psoas muscle and then incised. A thorough discectomy was performed. Instruments were used to clear the interspace of disc material. An anterior entry with posterior trajectory was performed to avoid neural elements.   After thorough discectomy was performed and this was performed using AP and lateral fluoroscopy a 12 lordotic by 60 x 22 mm titanium implant was packed with small BMP and Attrax. This was tamped into position and its position was confirmed on AP and lateral fluoroscopy. Subsequently exposure was performed at the L3-4 level and similar dissection was performed with locking of the self-retaining retractor. At this level were able to place a 12 lordotic by 22 x 60 mm implant packed in a similar fashion. At the L2-3 level were able to place an 10 mm lordotic by 55 x 22 mm implant packed in a similar fashion. Hemostasis was assured the wounds were irrigated and closed with interrupted Vicryl sutures.  Sterile occlusive dressings were placed. Retractor times were:  L 45: 19 minutes;  L 34: 23 minutes; L 23: 16 minutes.   Patient was then turned into a prone position on the [place] table and using AP and lateral fluoroscopy throughout this portion of the  procedure, pedicle screws were placed using Reline Nuvasive cannulated percutaneous screws. 2 screws were placed at L2 and (6.5 x 50 mm) and 2 at L3 (6.5 x 50), and two at L4 and L 5 of a similar size. 120 mm rod was then affixed to the screw heads do  a separate stab incision and locked down on the screws on the left and 120 mm rod on the right. All connections were then torqued and the Towers were disassembled. The wounds were irrigated and then closed with 1, 2-0 and 3-0 Vicryl stitches. Sterile occlusive dressing was placed with Dermabond. Long-acting Marcaine was injected. The patient was then extubated in the operating room and taken to recovery in stable and satisfactory condition having tolerated his operation well. Counts were correct at the end of the case.  PLAN OF CARE: Admit to inpatient   PATIENT DISPOSITION:  PACU - hemodynamically stable.   Delay start of Pharmacological VTE agent (>24hrs) due to surgical blood loss or risk of bleeding: yes

## 2019-12-03 NOTE — Interval H&P Note (Signed)
History and Physical Interval Note:  12/03/2019 12:22 PM  [name]  has presented today for surgery, with the diagnosis of Degenerative lumbar spinal stenosis.  The various methods of treatment have been discussed with the patient and family. After consideration of risks, benefits and other options for treatment, the patient has consented to  Procedure(s) with comments: Left Lumbar 2-3 Lumbar 3-4 Lumbar 4-5 Anterior lateral lumbar interbody fusion with percutaneous pedicle screw fixation (Left) - 3C LUMBAR PERCUTANEOUS PEDICLE SCREW 3 LEVEL (N/A) as a surgical intervention.  The patient's history has been reviewed, patient examined, no change in status, stable for surgery.  I have reviewed the patient's chart and labs.  Questions were answered to the patient's satisfaction.     Dorian Heckle

## 2019-12-03 NOTE — Op Note (Signed)
12/03/2019  6:03 PM  PATIENT:  Jonathan Stephens  59 y.o. male  PRE-OPERATIVE DIAGNOSIS:  Degenerative lumbar spinal stenosis, lumbar scoliosis, lumbar spondylolisthesis, lumbar foraminal stenosis, lumbago, lumbar radiculopathy  POST-OPERATIVE DIAGNOSIS:  Degenerative lumbar spinal stenosis, lumbar scoliosis, lumbar spondylolisthesis, lumbar foraminal stenosis, lumbago, lumbar radiculopathy   PROCEDURE:  Procedure(s): Left Lumbar two-threeLumbar three-four Lumbar four-five Anterior lateral lumbar interbody fusion with percutaneous pedicle screw fixation (Left) LUMBAR PERCUTANEOUS PEDICLE SCREW Fixation Lumbar Two-Three/Lumbar Three-Four/ Lumbar Four-Five (N/A)  SURGEON:  Surgeon(s) and Role:    * Deanza Upperman, MD - Primary  PHYSICIAN ASSISTANT: McDaniel, NP  ASSISTANTS: Poteat, RN   Hendren, MS3   ANESTHESIA:   general  EBL:  200 mL   BLOOD ADMINISTERED:none  DRAINS: none   LOCAL MEDICATIONS USED:  MARCAINE    and LIDOCAINE   SPECIMEN:  No Specimen  DISPOSITION OF SPECIMEN:  N/A  COUNTS:  YES  TOURNIQUET:  * No tourniquets in log *  DICTATION: Patient is a 59-year-old with severe spondylosis stenosis and scoliosis of the lumbar spine. It was elected to take him to surgery for anterolateral decompression and posterior pedicle screw fixation at the L 23, L 34, L 45 levels.  Procedure: Patient was brought to the operating room and placed in a left lateral decubitus position on the operative table and using orthogonally projected C-arm fluoroscopy the patient was placed so that the L2-3 L3-4 and L 45 levels were visualized in AP and lateral plane. The patient was then taped into position. The table was flexed so as to expose the L 45 level as the patient has a high iliac crest. Skin was marked along with a posterior finger dissection incision. His flank was then prepped and draped in usual sterile fashion and incisions were made sequentially at L 45,  L3-4 and L2-3 levels.  Posterior finger dissection was made to enter the retroperitoneal space and then subsequently the probe was inserted into the psoas muscle from the left side initially at the L 45 level. After mapping the neural elements were able to dock the probe per the midpoint of this vertebral level and without indications electrically of too close proximity to the neural tissues. Subsequently the self-retaining tractor was.after sequential dilators were utilized the shim was employed and the interspace was cleared of psoas muscle and then incised. A thorough discectomy was performed. Instruments were used to clear the interspace of disc material. An anterior entry with posterior trajectory was performed to avoid neural elements.   After thorough discectomy was performed and this was performed using AP and lateral fluoroscopy a 12 lordotic by 60 x 22 mm titanium implant was packed with small BMP and Attrax. This was tamped into position and its position was confirmed on AP and lateral fluoroscopy. Subsequently exposure was performed at the L3-4 level and similar dissection was performed with locking of the self-retaining retractor. At this level were able to place a 12 lordotic by 22 x 60 mm implant packed in a similar fashion. At the L2-3 level were able to place an 10 mm lordotic by 55 x 22 mm implant packed in a similar fashion. Hemostasis was assured the wounds were irrigated and closed with interrupted Vicryl sutures.  Sterile occlusive dressings were placed. Retractor times were:  L 45: 19 minutes;  L 34: 23 minutes; L 23: 16 minutes.   Patient was then turned into a prone position on the [place] table and using AP and lateral fluoroscopy throughout this portion of the   procedure, pedicle screws were placed using Reline Nuvasive cannulated percutaneous screws. 2 screws were placed at L2 and (6.5 x 50 mm) and 2 at L3 (6.5 x 50), and two at L4 and L 5 of a similar size. 120 mm rod was then affixed to the screw heads do  a separate stab incision and locked down on the screws on the left and 120 mm rod on the right. All connections were then torqued and the Towers were disassembled. The wounds were irrigated and then closed with 1, 2-0 and 3-0 Vicryl stitches. Sterile occlusive dressing was placed with Dermabond. Long-acting Marcaine was injected. The patient was then extubated in the operating room and taken to recovery in stable and satisfactory condition having tolerated his operation well. Counts were correct at the end of the case.  PLAN OF CARE: Admit to inpatient   PATIENT DISPOSITION:  PACU - hemodynamically stable.   Delay start of Pharmacological VTE agent (>24hrs) due to surgical blood loss or risk of bleeding: yes  

## 2019-12-03 NOTE — Transfer of Care (Signed)
Immediate Anesthesia Transfer of Care Note  Patient: Jonathan Stephens  Procedure(s) Performed: Left Lumbar two-threeLumbar three-four Lumbar four-five Anterior lateral lumbar interbody fusion with percutaneous pedicle screw fixation (Left ) LUMBAR PERCUTANEOUS PEDICLE SCREW Fixation Lumbar Two-Three/Lumbar Three-Four/ Lumbar Four-Five (N/A )  Patient Location: PACU  Anesthesia Type:General  Level of Consciousness: awake  Airway & Oxygen Therapy: Patient Spontanous Breathing and Patient connected to face mask oxygen  Post-op Assessment: Report given to RN and Post -op Vital signs reviewed and stable  Post vital signs: Reviewed and stable  Last Vitals:  Vitals Value Taken Time  BP 130/82 12/03/19 1750  Temp    Pulse 96 12/03/19 1751  Resp 19 12/03/19 1751  SpO2 94 % 12/03/19 1751  Vitals shown include unvalidated device data.  Last Pain:  Vitals:   12/03/19 1104  TempSrc:   PainSc: 0-No pain      Patients Stated Pain Goal: 3 (12/03/19 1104)  Complications: No complications documented.

## 2019-12-04 NOTE — Progress Notes (Signed)
Patient is discharged from room 3C06 at this time. Alert and in stable condition. IV site d/c'd and instructions read to patient and spouse with understanding verbalized and all questions answered. Left unit via wheelchair with all belongings at side 

## 2019-12-04 NOTE — Evaluation (Addendum)
Occupational Therapy Evaluation & Discharge Patient Details Name: Jonathan Stephens MRN: [id number] DOB: 15-Dec-1960 Today's Date: 12/04/2019    History of Present Illness 59 y.o. male presenting with degenerative lumbar spinal stenosis s/p L 2-3, 3-4 and 4-5 ALIF. 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   Clinical Impression   PTA patient was independent with ADLs/IADLs without use of AD/DME. Patient currently presents at baseline level of function demonstrating ADLs, ADL transfers, and functional mobility in hospital room with Mod I to I without AD/DME. Patient able to recall 3/3 back precautions demonstrating good adherence during functional activity. Patient also reports assist from his wife who is an Charity fundraiser upon return home. Patient does not require continued acute occupational therapy services with OT to sign off at this time.     Follow Up Recommendations  No OT follow up    Equipment Recommendations  None recommended by OT (Patient has equiptment from previous sx)    Recommendations for Other Services       Precautions / Restrictions Precautions Precautions: Fall;Back Precaution Booklet Issued: Yes (comment) Precaution Comments: Patient able to recall 3/3 back precautions.  Required Braces or Orthoses: Spinal Brace Spinal Brace: Lumbar corset;Applied in sitting position Restrictions Weight Bearing Restrictions: No      Mobility Bed Mobility               General bed mobility comments: Patient seated at EOB upon entry.     Transfers Overall transfer level: Independent Equipment used: None             General transfer comment: Patient demonstrates sit to stand transfers from various surfaces with indepedence and good adherence to back precautions.     Balance Overall balance assessment: No apparent balance deficits (not formally assessed)                                         ADL either performed  or assessed with clinical judgement   ADL Overall ADL's : At baseline                                       General ADL Comments: Patient demonstrates Mod I to I with toileting/hygiene/clothing management, UB/LB dressing, and ADL transfers with slightly increased time 2/2 pain.      Vision Baseline Vision/History: Wears glasses Wears Glasses: At all times Patient Visual Report: No change from baseline Vision Assessment?: No apparent visual deficits     Perception     Praxis      Pertinent Vitals/Pain Pain Assessment: 0-10 Pain Score: 9  Pain Location: Incision Pain Descriptors / Indicators: Grimacing;Guarding;Constant Pain Intervention(s): Limited activity within patient's tolerance;Monitored during session;Premedicated before session;Repositioned     Hand Dominance Right   Extremity/Trunk Assessment Upper Extremity Assessment Upper Extremity Assessment: Overall WFL for tasks assessed   Lower Extremity Assessment Lower Extremity Assessment: Defer to PT evaluation   Cervical / Trunk Assessment Cervical / Trunk Assessment: Other exceptions (s/p back sx)   Communication Communication Communication: No difficulties   Cognition Arousal/Alertness: Awake/alert Behavior During Therapy: WFL for tasks assessed/performed Overall Cognitive Status: Within Functional Limits for tasks assessed  General Comments       Exercises     Shoulder Instructions      Home Living Family/patient expects to be discharged to:: Private residence Living Arrangements: Spouse/significant other Available Help at Discharge: Family;Available 24 hours/day Type of Home: House Home Access: Stairs to enter [employer] of Steps: 4 Entrance Stairs-Rails: Right;Left;Can reach both Home Layout: One level     Bathroom Shower/Tub: Producer, television/film/video: Standard     Home Equipment: Bedside commode;Grab  bars - tub/shower;Hand held shower head          Prior Functioning/Environment Level of Independence: Independent                 OT Problem List: Pain      OT Treatment/Interventions:      OT Goals(Current goals can be found in the care plan section) Acute Rehab OT Goals Patient Stated Goal: To return home.   OT Frequency:     Barriers to D/C:            Co-evaluation              AM-PAC OT "6 Clicks" Daily Activity     Outcome Measure Help from another person eating meals?: None Help from another person taking care of personal grooming?: None Help from another person toileting, which includes using toliet, bedpan, or urinal?: None Help from another person bathing (including washing, rinsing, drying)?: None Help from another person to put on and taking off regular upper body clothing?: None Help from another person to put on and taking off regular lower body clothing?: None 6 Click Score: 24   End of Session Equipment Utilized During Treatment: Gait belt Nurse Communication: Mobility status  Activity Tolerance: Patient tolerated treatment well Patient left: Other (comment);with call bell/phone within reach (Seated EOB)  OT Visit Diagnosis: Pain Pain - part of body:  (Back)                Time: 1308-6578 OT Time Calculation (min): 8 min Charges:  OT General Charges $OT Visit: 1 Visit OT Evaluation $OT Eval Low Complexity: 1 Low  Jawanda Passey H. OTR/L Supplemental OT, Department of rehab services [phone]  Mikey Maffett R H. 12/04/2019, 9:30 AM

## 2019-12-04 NOTE — Discharge Summary (Addendum)
Physician Discharge Summary  Patient ID: Jonathan Stephens MRN: [id number] DOB/AGE: [birth year] 59 y.o.  Admit date: 12/03/2019 Discharge date: 12/04/2019  Admission Diagnoses: Degenerative lumbar spinal stenosis, lumbar scoliosis, lumbar spondylolisthesis, lumbar foraminal stenosis, lumbago, lumbar radiculopathy  Discharge Diagnoses: Degenerative lumbar spinal stenosis, lumbar scoliosis, lumbar spondylolisthesis, lumbar foraminal stenosis, lumbago, lumbar radiculopathy  Active Problems:   Lumbar scoliosis   Discharged Condition: good  Hospital Course: The patient was admitted on 11/03/2019 and taken to the operating room where the patient underwent decompression and fusion. The patient tolerated the procedure well and was taken to the recovery room and then to the floor in stable condition. The hospital course was routine. There were no complications. The wounds remained clean dry and intact. Pt had appropriate back and side soreness. No complaints of leg pain or new N/T/W. The patient remained afebrile with stable vital signs, and tolerated a regular diet. The patient continued to increase activities, and pain was well controlled with oral pain medications.  Consults: None  Significant Diagnostic Studies: radiology: X-Ray  Treatments: surgery: Left Lumbar two-threeLumbar three-four Lumbar four-five Anterior lateral lumbar interbody fusion with percutaneous pedicle screw fixation (Left) LUMBAR PERCUTANEOUS PEDICLE SCREW Fixation Lumbar Two-Three/Lumbar Three-Four/ Lumbar Four-Five  Discharge Exam: Blood pressure 140/71, pulse 67, temperature 97.7 F (36.5 C), temperature source Oral, resp. rate 18, height 6\' 2"  (1.88 m), weight 99.8 kg, SpO2 94 %.  Physical Exam: Awake, A/O X 4, and conversant. Patient is doing well overall and is in good spirits. MAEW with good strength that is symmetric bilaterally.  Dressings are clean dry intact.  Incisions are well approximated with no drainage,  erythema, or edema.  Disposition: Discharge disposition: 01-Home or Self Care        Allergies as of 12/04/2019      Reactions   Bee Venom Swelling      Medication List    STOP taking these medications   cyclobenzaprine 10 MG tablet Commonly known as: FLEXERIL   ibuprofen 200 MG tablet Commonly known as: ADVIL     TAKE these medications   ferrous sulfate 325 (65 FE) MG EC tablet Take 325 mg by mouth 3 (three) times daily with meals.   Fish Oil 1000 MG Caps Take 1,000 mg by mouth daily.   gabapentin 300 MG capsule Commonly known as: NEURONTIN Take 100 mg by mouth 3 (three) times daily as needed (pain).   GLUCOSAMINE CHOND COMPLEX/MSM PO Take 1 tablet by mouth daily.   multivitamin with minerals Tabs tablet Take 1 tablet by mouth daily.   simvastatin 10 MG tablet Commonly known as: ZOCOR Take 10 mg by mouth at bedtime.   vitamin E 180 MG (400 UNITS) capsule Take 400 Units by mouth daily.        Signed: 14/03/2019, DNP, NP-C 12/04/2019, 11:26 AM

## 2019-12-04 NOTE — Progress Notes (Signed)
Subjective: Patient reports that he is doing "great." He reported that he has ambulated multiple times and his preoperative symptoms have greatly improved. He does report some residual toe numbness and expected incisional discomfort.   Objective: Vital signs in last 24 hours: Temp:  [97.5 F (36.4 C)-98.1 F (36.7 C)] 97.7 F (36.5 C) (12/03 0748) Pulse Rate:  [67-96] 67 (12/03 0748) Resp:  [9-20] 18 (12/03 0748) BP: (117-159)/(66-85) 140/71 (12/03 0748) SpO2:  [92 %-98 %] 94 % (12/03 0748) Weight:  [99.8 kg] 99.8 kg (12/02 1031)  Intake/Output from previous day: 12/02 0701 - 12/03 0700 In: 2100 [I.V.:1600; IV Piggyback:500] Out: 1250 [Urine:1050; Blood:200] Intake/Output this shift: No intake/output data recorded.  Physical Exam: Awake, A/O X 4, and conversant. Patient is doing well overall and is in good spirits. MAEW with good strength that is symmetric bilaterally.  Dressings are clean dry intact.  Incisions are well approximated with no drainage, erythema, or edema.  Lab Results: Recent Labs    12/01/19 1430  WBC 7.1  HGB 13.4  HCT 39.4  PLT 267   BMET Recent Labs    12/01/19 1430  NA 141  K 3.6  CL 103  CO2 26  GLUCOSE 96  BUN 6*  CREATININE 1.03  CALCIUM 9.7    Studies/Results: DG Lumbar Spine 2-3 Views  Result Date: 12/03/2019 CLINICAL DATA:  Surgery, elective. Additional history provided: L2-5 pos pedicle screws. Provided fluoroscopy time 2 minutes, 24 seconds (96.05 mGy). EXAM: LUMBAR SPINE - 2-3 VIEW; DG C-ARM 1-60 MIN COMPARISON:  Fluoroscopic images of the lumbar spine earlier the same day 12/03/2019. Lumbar spine MRI 10/14/2019. FINDINGS: PA and lateral view intraoperative fluoroscopic images of the lumbar spine are submitted, 6 images total. The final images taken at 5:03 p.m. demonstrate a posterior spinal fusion construct spanning what are presumably the L2-L5 levels (the exact levels are difficult to ascertain given the field of view). The posterior  spinal fusion construct consists of bilateral pedicle screws and vertical interconnecting rods. Interbody devices are also present at what are presumed to be the L2-L3, L3-L4 and L4-L5 levels. IMPRESSION: 6 intraoperative fluoroscopic images of the lumbar spine, as described. Electronically Signed   By: Jackey Loge DO   On: 12/03/2019 18:12   DG Lumbar Spine 2-3 Views  Result Date: 12/03/2019 CLINICAL DATA:  L3-L5 fusion EXAM: LUMBAR SPINE - 2-3 VIEW COMPARISON:  09/21/2019 FINDINGS: Four fluoroscopic images are obtained during the performance of the procedure and are provided for interpretation only. Images demonstrate discectomy at 3 contiguous levels of the lumbar spine, likely L2-3, L3-4, and L4-5. Alignment appears grossly anatomic. Please refer to operative report for full description of findings. FLUOROSCOPY TIME:  2 minutes 45 seconds, 113.62 mGy IMPRESSION: 1. Intraoperative exam as above. Findings called to the operating room at the time of interpretation. Electronically Signed   By: Sharlet Salina M.D.   On: 12/03/2019 15:21   DG C-Arm 1-60 Min  Result Date: 12/03/2019 CLINICAL DATA:  Surgery, elective. Additional history provided: L2-5 pos pedicle screws. Provided fluoroscopy time 2 minutes, 24 seconds (96.05 mGy). EXAM: LUMBAR SPINE - 2-3 VIEW; DG C-ARM 1-60 MIN COMPARISON:  Fluoroscopic images of the lumbar spine earlier the same day 12/03/2019. Lumbar spine MRI 10/14/2019. FINDINGS: PA and lateral view intraoperative fluoroscopic images of the lumbar spine are submitted, 6 images total. The final images taken at 5:03 p.m. demonstrate a posterior spinal fusion construct spanning what are presumably the L2-L5 levels (the exact levels are difficult to  ascertain given the field of view). The posterior spinal fusion construct consists of bilateral pedicle screws and vertical interconnecting rods. Interbody devices are also present at what are presumed to be the L2-L3, L3-L4 and L4-L5 levels.  IMPRESSION: 6 intraoperative fluoroscopic images of the lumbar spine, as described. Electronically Signed   By: Jackey Loge DO   On: 12/03/2019 18:12    Assessment/Plan: Patient is post-op day 1 s/p L2/3, L3/4, and L4/5 XLIF with lumbar percutaneous pedicle screw fixation. He is recovering well and reports a resolution of his preoperative symptoms except for some right foot parasthesia.  His only complaint is mild incisional discomfort.  He has worked with PT and OT who are recommending no PT/OT follow-up needed.  Continue LSO brace when OOB. Continue working on pain control, mobility and ambulating patient. Will plan for discharge today.    LOS: 1 day    Council Mechanic, DNP, NP-C 12/04/2019, 9:32 AM

## 2019-12-04 NOTE — Discharge Instructions (Signed)

## 2019-12-04 NOTE — Anesthesia Postprocedure Evaluation (Addendum)
Anesthesia Post Note  Patient: Jonathan Stephens  Procedure(s) Performed: Left Lumbar two-threeLumbar three-four Lumbar four-five Anterior lateral lumbar interbody fusion with percutaneous pedicle screw fixation (Left ) LUMBAR PERCUTANEOUS PEDICLE SCREW Fixation Lumbar Two-Three/Lumbar Three-Four/ Lumbar Four-Five (N/A )     Patient location during evaluation: PACU Anesthesia Type: General Level of consciousness: awake Pain management: pain level controlled Vital Signs Assessment: post-procedure vital signs reviewed and stable Respiratory status: spontaneous breathing Cardiovascular status: stable Postop Assessment: no apparent nausea or vomiting Anesthetic complications: no   No complications documented.  Last Vitals:  Vitals:   12/04/19 0551 12/04/19 0748  BP: 129/71 140/71  Pulse: 82 67  Resp: 18 18  Temp: 36.6 C 36.5 C  SpO2: 95% 94%    Last Pain:  Vitals:   12/04/19 1056  TempSrc:   PainSc: 5                  [name]

## 2019-12-04 NOTE — Evaluation (Signed)
Physical Therapy Evaluation Patient Details Name: Jonathan Stephens MRN: [id number] DOB: January 05, 1960 Today's Date: 12/04/2019   History of Present Illness  60 y.o. male presenting with degenerative lumbar spinal stenosis s/p L 2-3, 3-4 and 4-5 ALIF12/2. 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  Clinical Impression  Patient presents with pain and post surgical deficits s/p above surgery. Pt lives at home with spouse and reports being independent for ADLs/IADLs and working as a Surveyor, mining. Today, pt tolerated bed mobility, transfers, gait and stair training mod I for safety. Education re: back precautions, log roll technique, positioning, brace, walking program etc. All education completed. Pt does not require skilled therapy services as pt functioning close to baseline. Discharge from therapy.    Follow Up Recommendations No PT follow up    Equipment Recommendations  None recommended by PT    Recommendations for Other Services       Precautions / Restrictions Precautions Precautions: Fall;Back Precaution Booklet Issued: Yes (comment) Precaution Comments: Reviewed back precautions and handout Required Braces or Orthoses: Spinal Brace Spinal Brace: Lumbar corset;Applied in sitting position Restrictions Weight Bearing Restrictions: No      Mobility  Bed Mobility Overal bed mobility: Needs Assistance Bed Mobility: Rolling;Sidelying to Sit Rolling: Modified independent (Device/Increase time) Sidelying to sit: Modified independent (Device/Increase time)       General bed mobility comments: HOB flat, no use of rails to simulate home. Cues for log roll technique.    Transfers Overall transfer level: Modified independent Equipment used: None             General transfer comment: Stood from EOB x1, no difficulties or LOB.  Ambulation/Gait Ambulation/Gait assistance: Modified independent (Device/Increase time) Gait Distance (Feet):  400 Feet Assistive device: None Gait Pattern/deviations: Step-through pattern;Decreased stride length   Gait velocity interpretation: 1.31 - 2.62 ft/sec, indicative of limited community ambulator General Gait Details: Slow, steady gait with no evidence of imbalance.  Stairs Stairs: Yes Stairs assistance: Modified independent (Device/Increase time) Stair Management: One rail Right Number of Stairs: 13 General stair comments: Cues for hand placement/technique.  Wheelchair Mobility    Modified Rankin (Stroke Patients Only)       Balance Overall balance assessment: No apparent balance deficits (not formally assessed)                                           Pertinent Vitals/Pain Pain Assessment: 0-10 Pain Score: 7  Pain Location: back Pain Descriptors / Indicators: Sore;Operative site guarding Pain Intervention(s): Monitored during session;Repositioned;Limited activity within patient's tolerance    Home Living Family/patient expects to be discharged to:: Private residence Living Arrangements: Spouse/significant other Available Help at Discharge: Family;Available 24 hours/day Type of Home: House Home Access: Stairs to enter Entrance Stairs-Rails: Right;Left;Can reach both Entrance Stairs-Number of Steps: 4 Home Layout: One level Home Equipment: Bedside commode;Grab bars - tub/shower;Hand held shower head;Cane - single point;Walker - 2 wheels      Prior Function Level of Independence: Independent         Comments: Working as a Curator. Drives. Independent for ADLs/IADLs.     Hand Dominance   Dominant Hand: Right    Extremity/Trunk Assessment   Upper Extremity Assessment Upper Extremity Assessment: Defer to OT evaluation    Lower Extremity Assessment Lower Extremity Assessment: Overall WFL for tasks assessed    Cervical / Trunk Assessment  Cervical / Trunk Assessment: Other exceptions Cervical / Trunk Exceptions: s/p back surgery   Communication   Communication: No difficulties  Cognition Arousal/Alertness: Awake/alert Behavior During Therapy: WFL for tasks assessed/performed Overall Cognitive Status: Within Functional Limits for tasks assessed                                        General Comments General comments (skin integrity, edema, etc.): bandages, clean dry and intact. Able to donn LSO sitting EOB with setup without difficulty or assist.    Exercises     Assessment/Plan    PT Assessment Patent does not need any further PT services  PT Problem List         PT Treatment Interventions      PT Goals (Current goals can be found in the Care Plan section)  Acute Rehab PT Goals Patient Stated Goal: To return home.  PT Goal Formulation: All assessment and education complete, DC therapy    Frequency     Barriers to discharge        Co-evaluation               AM-PAC PT "6 Clicks" Mobility  Outcome Measure Help needed turning from your back to your side while in a flat bed without using bedrails?: None Help needed moving from lying on your back to sitting on the side of a flat bed without using bedrails?: None Help needed moving to and from a bed to a chair (including a wheelchair)?: None Help needed standing up from a chair using your arms (e.g., wheelchair or bedside chair)?: None Help needed to walk in hospital room?: None Help needed climbing 3-5 steps with a railing? : None 6 Click Score: 24    End of Session Equipment Utilized During Treatment: Back brace Activity Tolerance: Patient tolerated treatment well Patient left: in bed;with call bell/phone within reach (sitting EOB eating breakfast) Nurse Communication: Mobility status PT Visit Diagnosis: Pain;Difficulty in walking, not elsewhere classified (R26.2) Pain - part of body:  (back)    Time: 7482-7078 PT Time Calculation (min) (ACUTE ONLY): 13 min   Charges:   PT Evaluation $PT Eval Moderate Complexity:  1 Mod          Vale Haven, PT, DPT Acute Rehabilitation Services Pager [phone] Office [phone]      Jonathan Stephens 12/04/2019, 10:50 AM

## 2019-12-08 ENCOUNTER — Encounter (HOSPITAL_COMMUNITY): Payer: Self-pay | Admitting: Neurosurgery

## 2019-12-08 MED FILL — Sodium Chloride IV Soln 0.9%: INTRAVENOUS | Qty: 1000 | Status: AC

## 2019-12-08 MED FILL — Heparin Sodium (Porcine) Inj 1000 Unit/ML: INTRAMUSCULAR | Qty: 30 | Status: AC

## 2020-12-22 DIAGNOSIS — I10 Essential (primary) hypertension: Secondary | ICD-10-CM | POA: Insufficient documentation

## 2020-12-22 DIAGNOSIS — R399 Unspecified symptoms and signs involving the genitourinary system: Secondary | ICD-10-CM | POA: Insufficient documentation

## 2020-12-22 DIAGNOSIS — Z8616 Personal history of COVID-19: Secondary | ICD-10-CM | POA: Insufficient documentation

## 2021-04-20 IMAGING — RF DG C-ARM 1-60 MIN
1 series · 6 of 6 positions shown · non-contrast
Comparison: Fluoroscopic images of the lumbar spine earlier the
same day 12/03/2019. Lumbar spine MRI 10/14/2019.

CLINICAL DATA: Surgery, elective. Additional history provided: L2-5
pos pedicle screws. Provided fluoroscopy time 2 minutes, 24 seconds
(96.05 mGy).

EXAM:
LUMBAR SPINE - 2-3 VIEW; DG C-ARM 1-60 MIN

[Series 1: run · 6 of 6 slices shown]
[im 1/6]
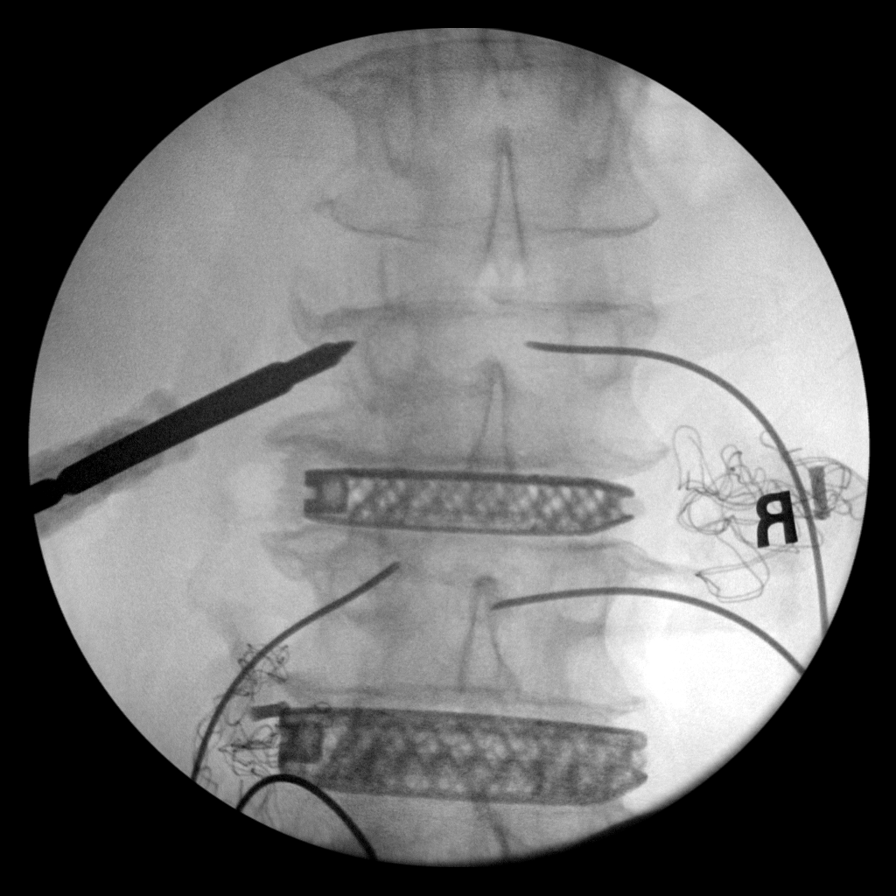
[im 2/6]
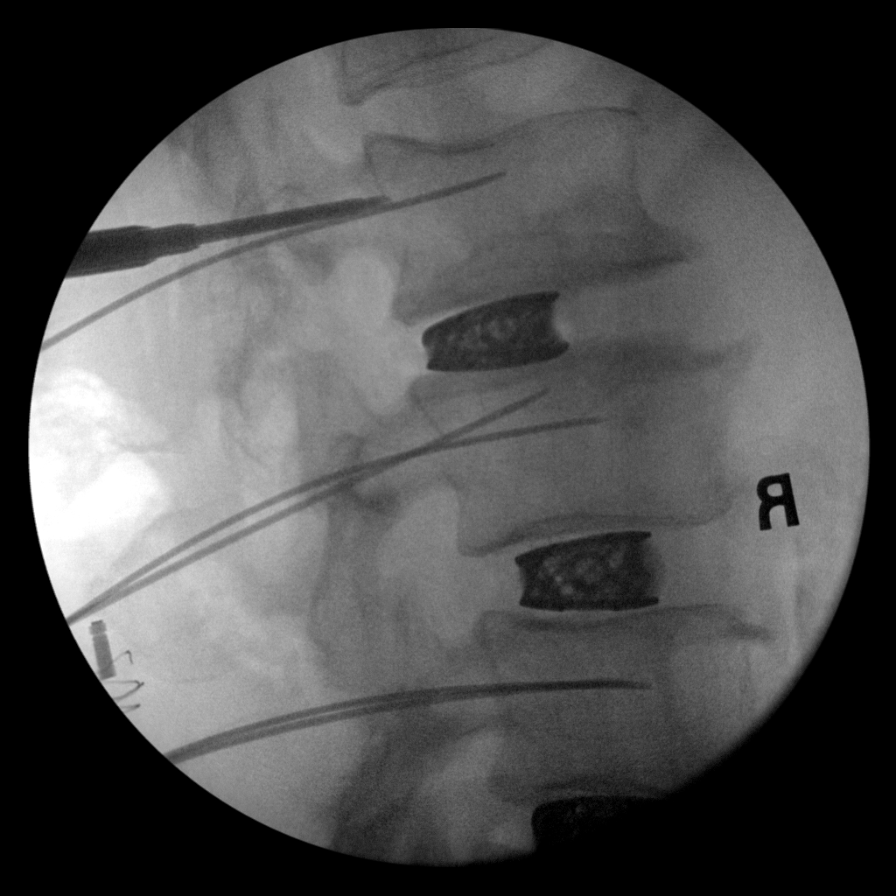
[im 3/6]
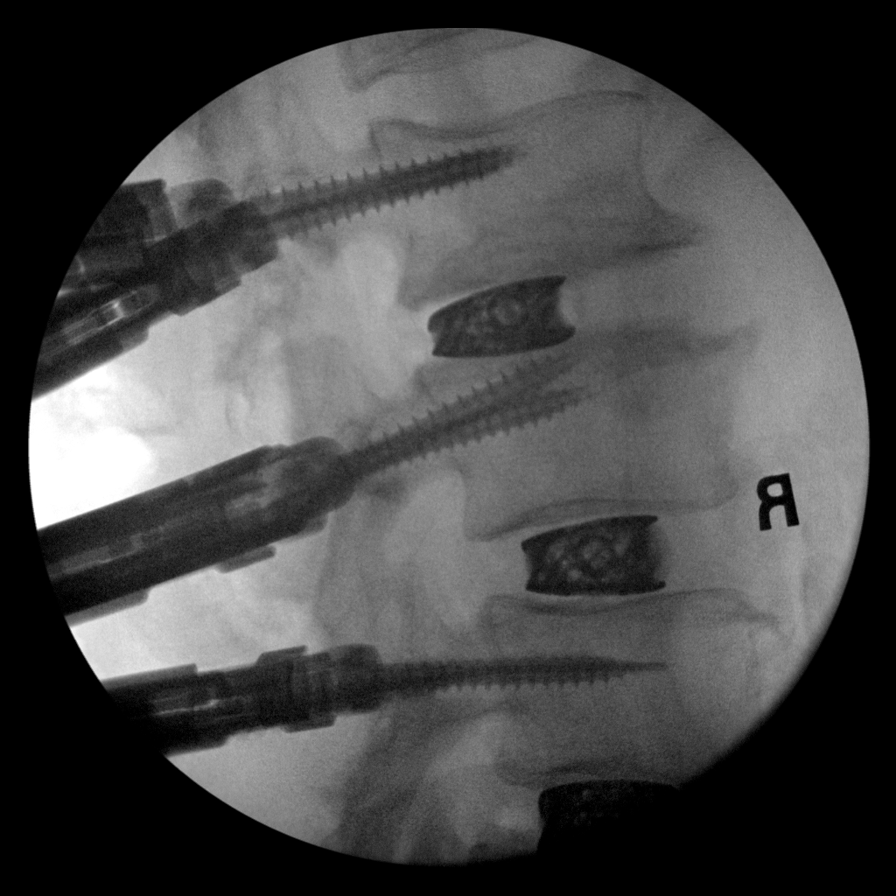
[im 4/6]
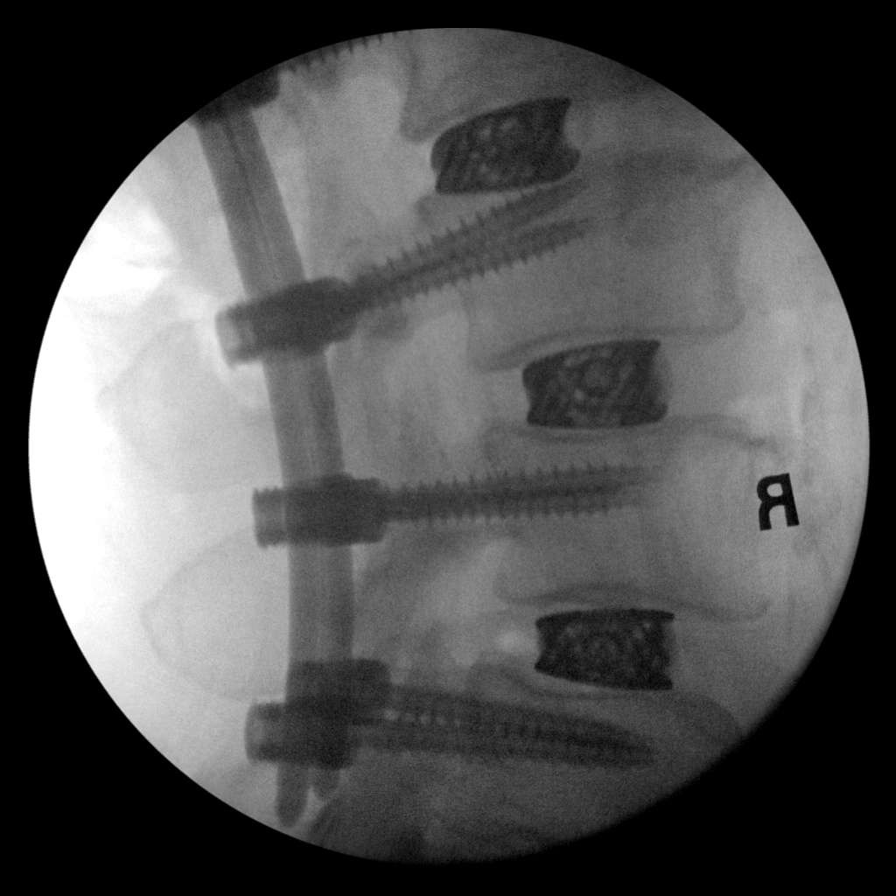
[im 5/6]
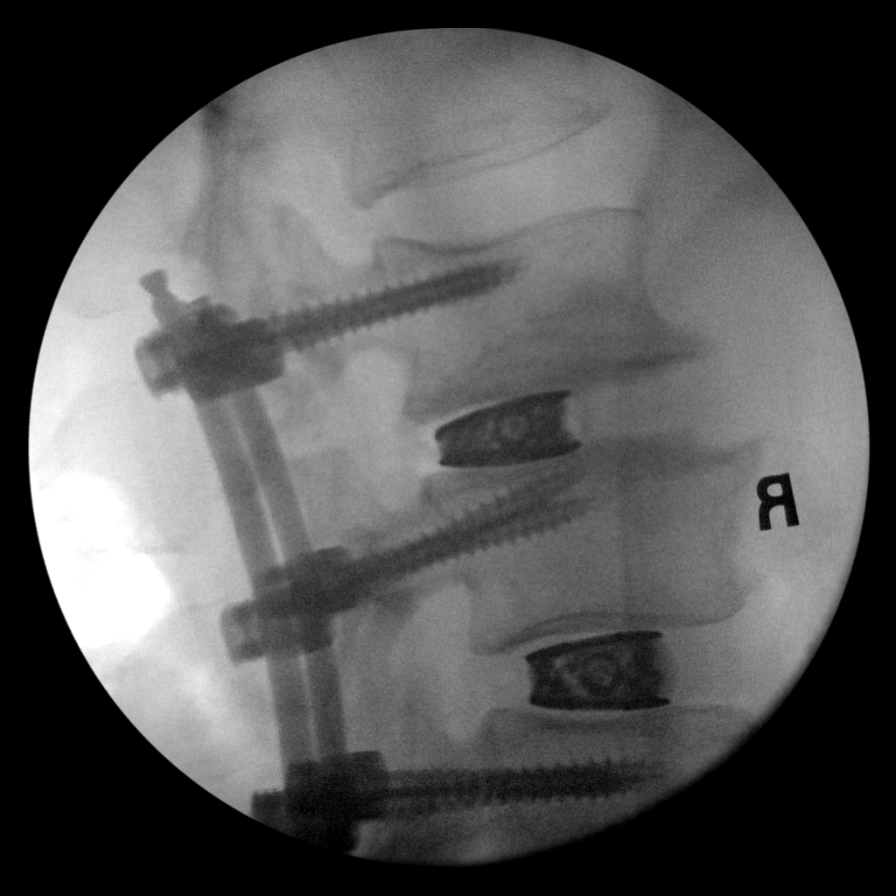
[im 6/6]
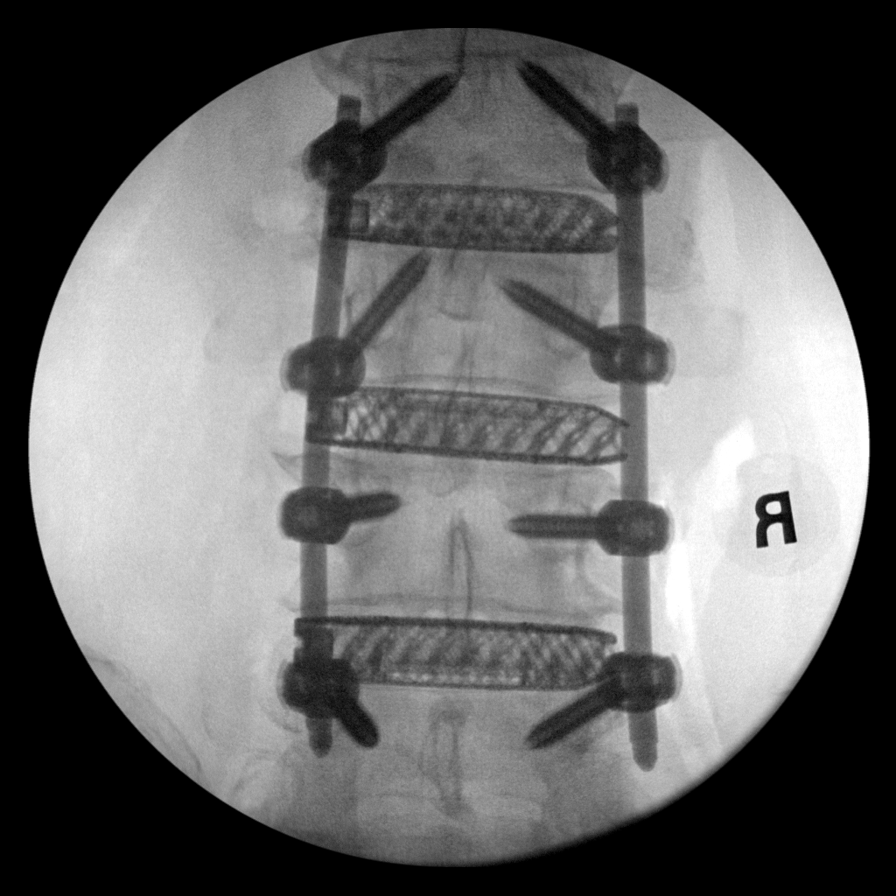

[6 of 6 positions shown; findings below may reference images not displayed]

FINDINGS: PA and lateral view intraoperative fluoroscopic images of the lumbar
spine are submitted, 6 images total. The final images taken at [DATE]
p.m. demonstrate a posterior spinal fusion construct spanning what
are presumably the L2-L5 levels (the exact levels are difficult to
ascertain given the field of view). The posterior spinal fusion
construct consists of bilateral pedicle screws and vertical
interconnecting rods. Interbody devices are also present at what are
presumed to be the L2-L3, L3-L4 and L4-L5 levels.
IMPRESSION: 6 intraoperative fluoroscopic images of the lumbar spine, as
described.

## 2021-04-20 IMAGING — RF DG LUMBAR SPINE 2-3V
1 series · 6 of 6 positions shown · non-contrast
Comparison: Fluoroscopic images of the lumbar spine earlier the
same day 12/03/2019. Lumbar spine MRI 10/14/2019.

CLINICAL DATA: Surgery, elective. Additional history provided: L2-5
pos pedicle screws. Provided fluoroscopy time 2 minutes, 24 seconds
(96.05 mGy).

EXAM:
LUMBAR SPINE - 2-3 VIEW; DG C-ARM 1-60 MIN

[Series 1: run · 6 of 6 slices shown]
[im 1/6]
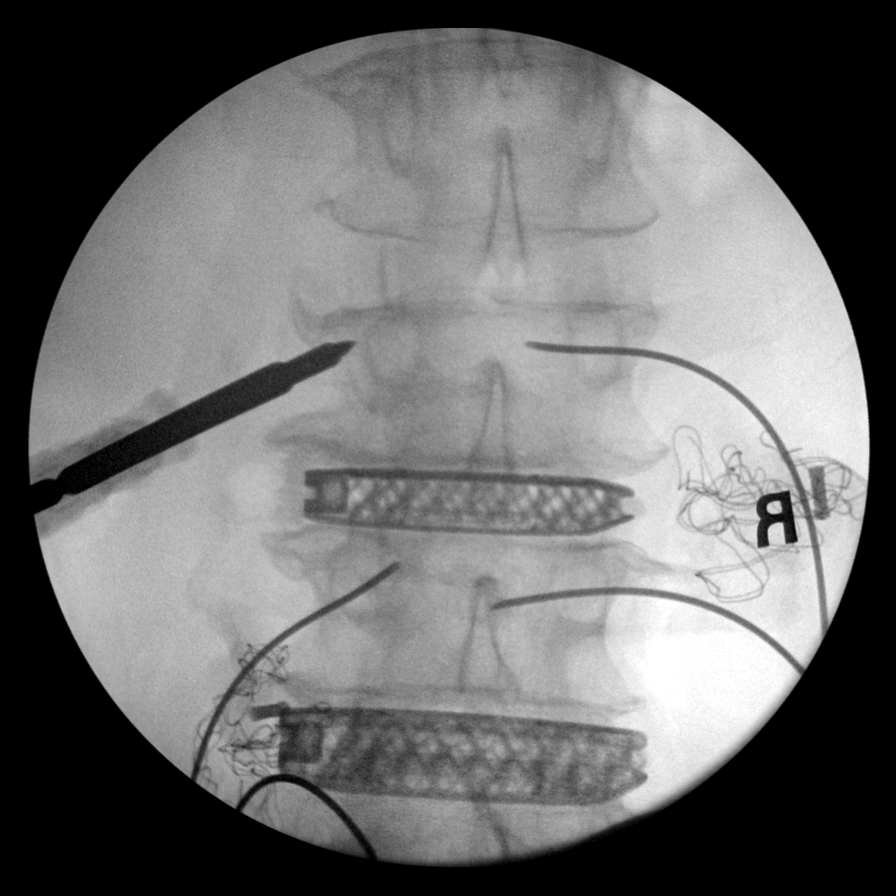
[im 2/6]
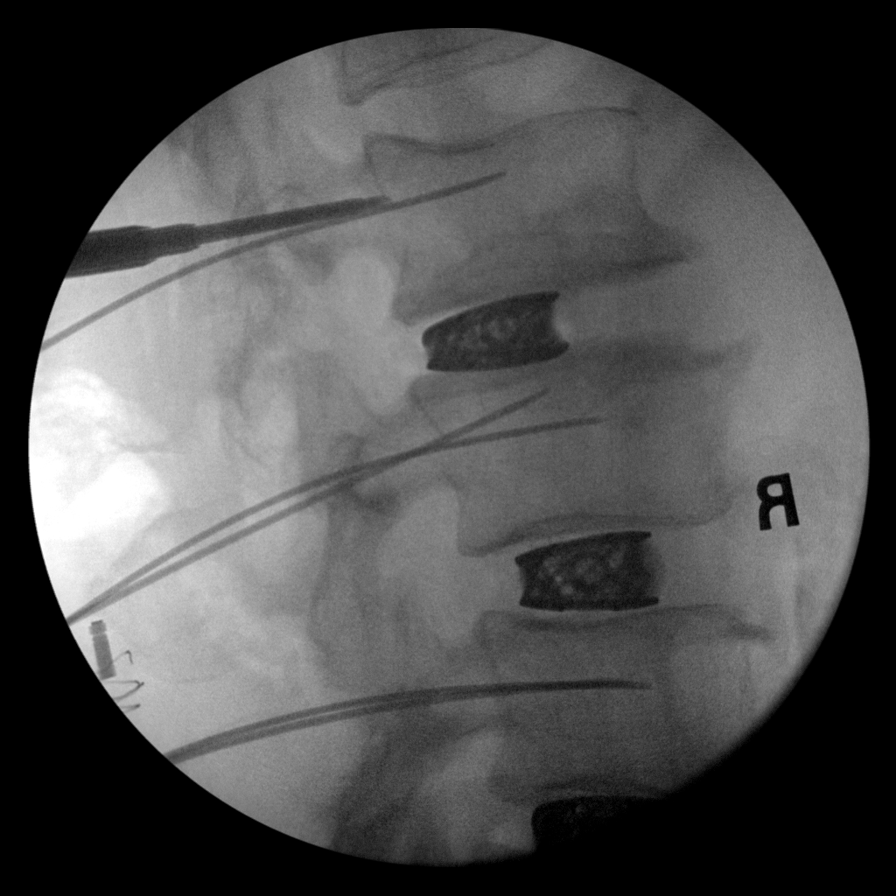
[im 3/6]
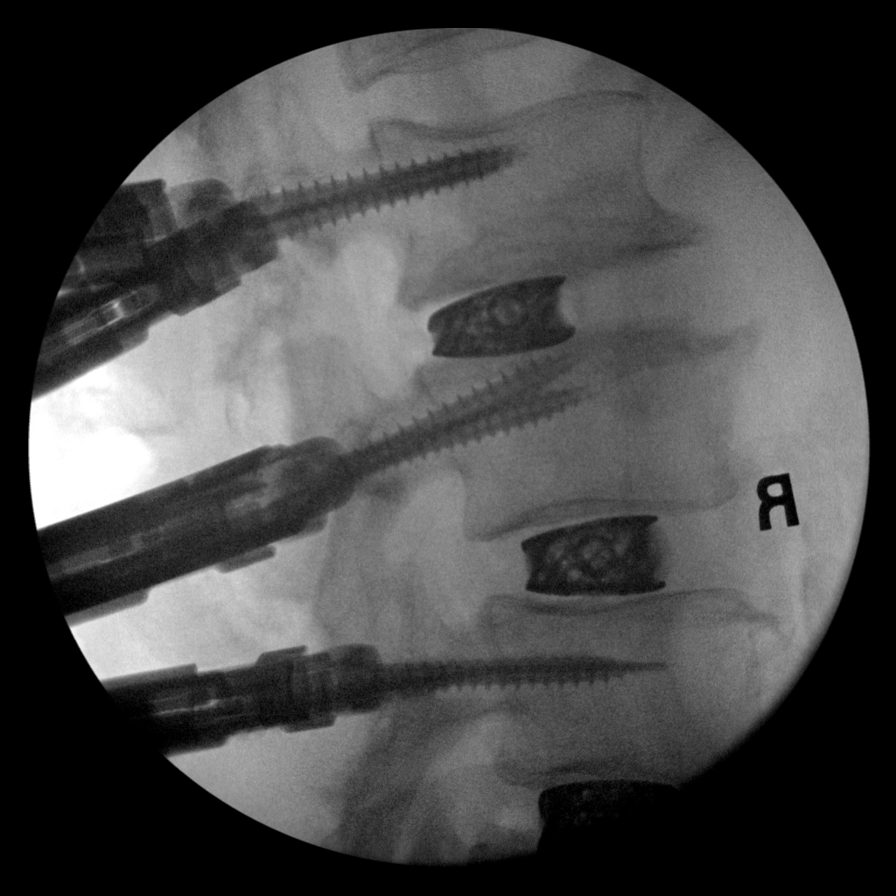
[im 4/6]
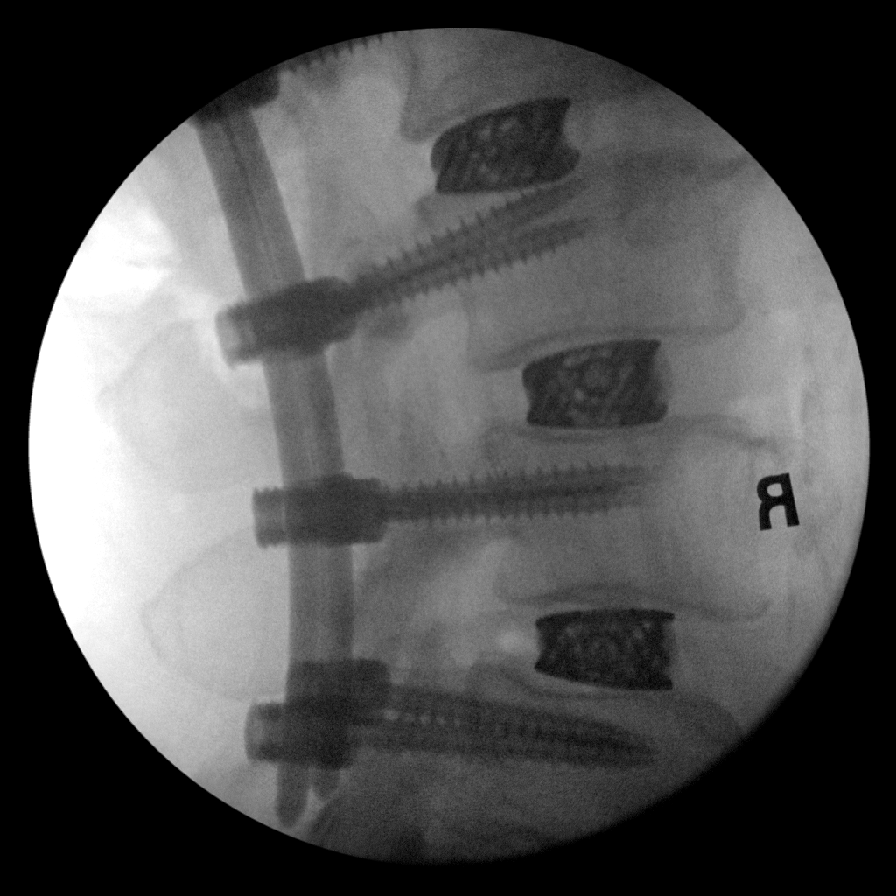
[im 5/6]
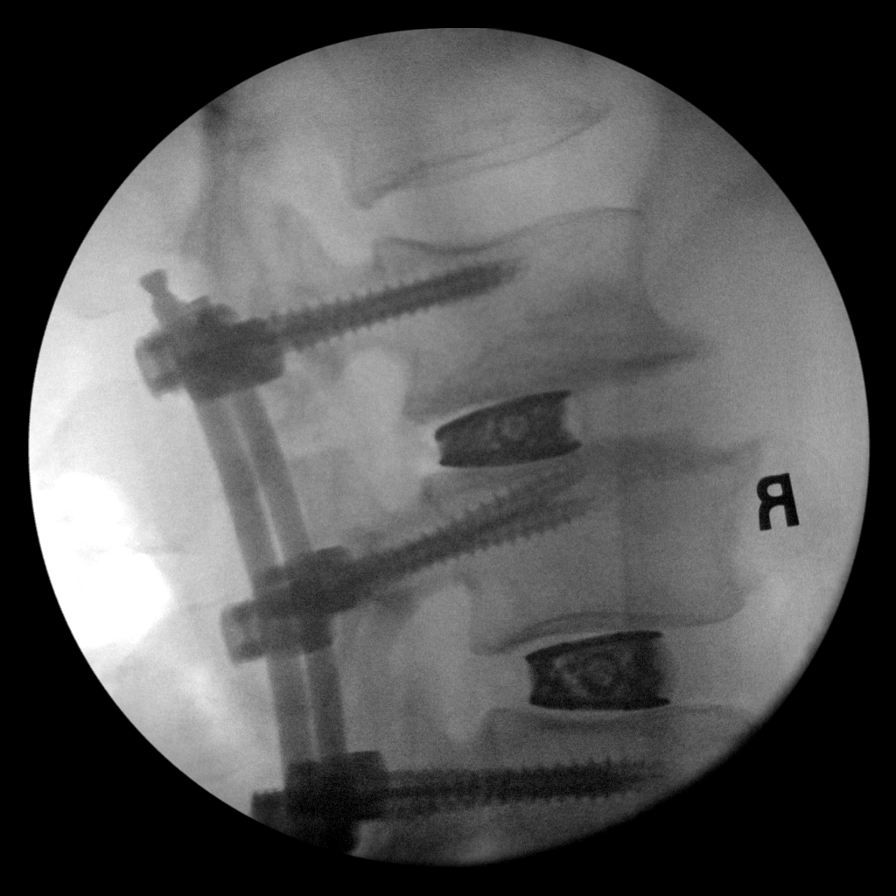
[im 6/6]
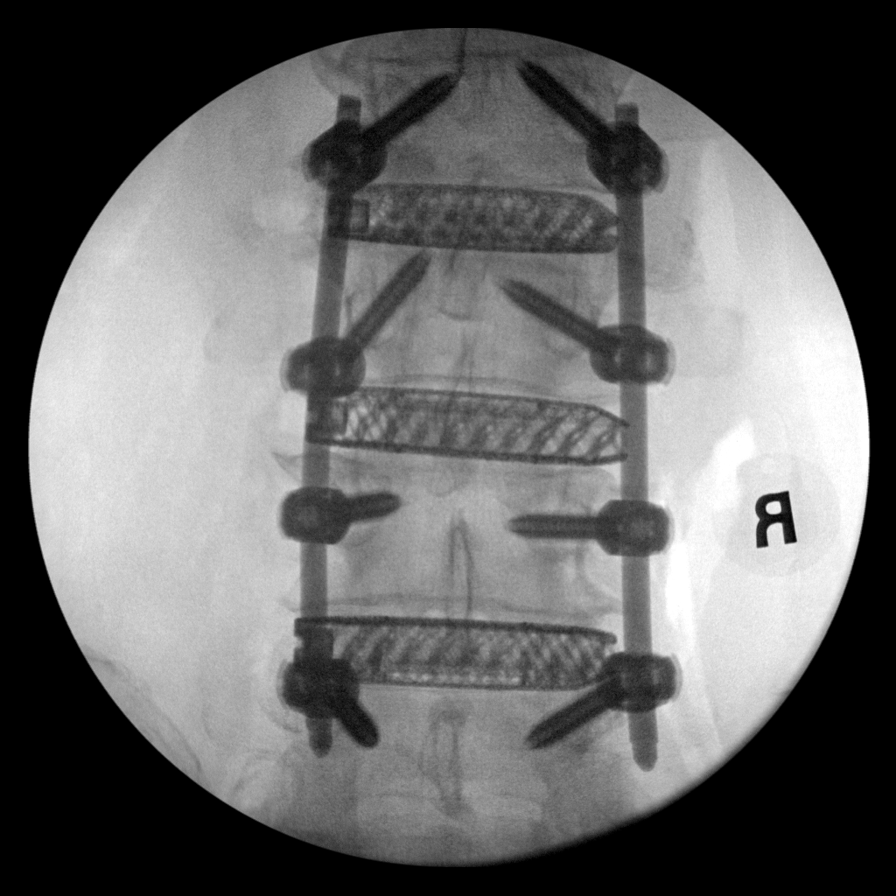

[6 of 6 positions shown; findings below may reference images not displayed]

FINDINGS: PA and lateral view intraoperative fluoroscopic images of the lumbar
spine are submitted, 6 images total. The final images taken at [DATE]
p.m. demonstrate a posterior spinal fusion construct spanning what
are presumably the L2-L5 levels (the exact levels are difficult to
ascertain given the field of view). The posterior spinal fusion
construct consists of bilateral pedicle screws and vertical
interconnecting rods. Interbody devices are also present at what are
presumed to be the L2-L3, L3-L4 and L4-L5 levels.
IMPRESSION: 6 intraoperative fluoroscopic images of the lumbar spine, as
described.

## 2021-06-27 DIAGNOSIS — G5793 Unspecified mononeuropathy of bilateral lower limbs: Secondary | ICD-10-CM | POA: Insufficient documentation

## 2021-08-06 DIAGNOSIS — G894 Chronic pain syndrome: Secondary | ICD-10-CM | POA: Insufficient documentation

## 2021-08-06 DIAGNOSIS — M899 Disorder of bone, unspecified: Secondary | ICD-10-CM | POA: Insufficient documentation

## 2021-08-06 DIAGNOSIS — M48061 Spinal stenosis, lumbar region without neurogenic claudication: Secondary | ICD-10-CM | POA: Insufficient documentation

## 2021-08-06 DIAGNOSIS — Z79899 Other long term (current) drug therapy: Secondary | ICD-10-CM | POA: Insufficient documentation

## 2021-08-06 DIAGNOSIS — Z789 Other specified health status: Secondary | ICD-10-CM | POA: Insufficient documentation

## 2021-08-06 NOTE — Progress Notes (Deleted)
Patient: Jonathan Stephens  Service Category: E/M  Provider: Gaspar Cola, MD  DOB: 10-20-1960  DOS: 08/07/2021  Referring Provider: Simona Huh, NP  MRN: [id number]  Setting: Ambulatory outpatient  PCP: Barbaraann Boys, MD  Type: New Patient  Specialty: Interventional Pain Management    Location: Office  Delivery: Face-to-face     Primary Reason(s) for Visit: Encounter for initial evaluation of one or more chronic problems (new to examiner) potentially causing chronic pain, and posing a threat to normal musculoskeletal function. (Level of risk: High) CC: No chief complaint on file.  HPI  Mr. Graumann is a 61 y.o. year old, male patient, who comes for the first time to our practice referred by Simona Huh, NP for our initial evaluation of his chronic pain. He has Primary localized osteoarthritis of left hip; Primary osteoarthritis of left hip; Abnormal SPEP; Normocytic anemia; Lumbar scoliosis; Anxiety; Centrilobular emphysema ([place]); Coronary artery disease involving native coronary artery of native heart without angina pectoris; Erectile dysfunction; Essential hypertension; H/O alcohol abuse; History of 2019 novel coronavirus disease (COVID-19); History of hepatitis C; History of tobacco use; Intervertebral disc disorder with radiculopathy of lumbar region; Lower urinary tract symptoms (LUTS); Left hip pain; Neuropathic pain of both feet; Non-seasonal allergic rhinitis; Screening for malignant neoplasm of respiratory organ; Degenerative lumbar spinal stenosis; Chronic pain syndrome; Pharmacologic therapy; Disorder of skeletal system; and Problems influencing health status on their problem list. Today he comes in for evaluation of his No chief complaint on file.  Pain Assessment: Location:     Radiating:   Onset:   Duration:   Quality:   Severity:  /10 (subjective, self-reported pain score)  Effect on ADL:   Timing:   Modifying factors:   BP:    HR:    Onset and Duration: {Hx; Onset and  Duration:210120511} Cause of pain: {Hx; Cause:210120521} Severity: {Pain Severity:210120502} Timing: {Symptoms; Timing:210120501} Aggravating Factors: {Causes; Aggravating pain factors:210120507} Alleviating Factors: {Causes; Alleviating Factors:210120500} Associated Problems: {Hx; Associated problems:210120515} Quality of Pain: {Hx; Symptom quality or Descriptor:210120531} Previous Examinations or Tests: {Hx; Previous examinations or test:210120529} Previous Treatments: {Hx; Previous Treatment:210120503}  ***  Today I took the time to provide the patient with information regarding my pain practice. The patient was informed that my practice is divided into two sections: an interventional pain management section, as well as a completely separate and distinct medication management section. I explained that I have procedure days for my interventional therapies, and evaluation days for follow-ups and medication management. Because of the amount of documentation required during both, they are kept separated. This means that there is the possibility that he may be scheduled for a procedure on one day, and medication management the next. I have also informed him that because of staffing and facility limitations, I no longer take patients for medication management only. To illustrate the reasons for this, I gave the patient the example of surgeons, and how inappropriate it would be to refer a patient to his/her care, just to write for the post-surgical antibiotics on a surgery done by a different surgeon.   Because interventional pain management is my board-certified specialty, the patient was informed that joining my practice means that they are open to any and all interventional therapies. I made it clear that this does not mean that they will be forced to have any procedures done. What this means is that I believe interventional therapies to be essential part of the diagnosis and proper management of  chronic pain conditions.  Therefore, patients not interested in these interventional alternatives will be better served under the care of a different practitioner.  The patient was also made aware of my Comprehensive Pain Management Safety Guidelines where by joining my practice, they limit all of their nerve blocks and joint injections to those done by our practice, for as long as we are retained to manage their care.   Historic Controlled Substance Pharmacotherapy Review  PMP and historical list of controlled substances: ***  Current opioid analgesics:   *** MME/day: *** mg/day  Historical Monitoring: The patient  reports no history of drug use. List of prior UDS Testing: No results found for: "MDMA", "COCAINSCRNUR", "PCPSCRNUR", "PCPQUANT", "CANNABQUANT", "THCU", "ETH", "CBDTHCR", "D8THCCBX", "D9THCCBX" Historical Background Evaluation: [place] PMP: PDMP reviewed during this encounter. Review of the past 73-months conducted.             PMP NARX Score Report:  Narcotic: 351 Sedative: 150 Stimulant: 000  Department of public safety, offender search: Editor, commissioning Information) Non-contributory Risk Assessment Profile: Aberrant behavior: None observed or detected today Risk factors for fatal opioid overdose: None identified today PMP NARX Overdose Risk Score: 100 Fatal overdose hazard ratio (HR): Calculation deferred Non-fatal overdose hazard ratio (HR): Calculation deferred Risk of opioid abuse or dependence: 0.7-3.0% with doses ? 36 MME/day and 6.1-26% with doses ? 120 MME/day. Substance use disorder (SUD) risk level: See below Personal History of Substance Abuse (SUD-Substance use disorder):  Alcohol:    Illegal Drugs:    Rx Drugs:    ORT Risk Level calculation:    ORT Scoring interpretation table:  Score <3 = Low Risk for SUD  Score between 4-7 = Moderate Risk for SUD  Score >8 = High Risk for Opioid Abuse   PHQ-2 Depression Scale:  Total score:    PHQ-2 Scoring interpretation  table: (Score and probability of major depressive disorder)  Score 0 = No depression  Score 1 = 15.4% Probability  Score 2 = 21.1% Probability  Score 3 = 38.4% Probability  Score 4 = 45.5% Probability  Score 5 = 56.4% Probability  Score 6 = 78.6% Probability   PHQ-9 Depression Scale:  Total score:    PHQ-9 Scoring interpretation table:  Score 0-4 = No depression  Score 5-9 = Mild depression  Score 10-14 = Moderate depression  Score 15-19 = Moderately severe depression  Score 20-27 = Severe depression (2.4 times higher risk of SUD and 2.89 times higher risk of overuse)   Pharmacologic Plan: As per protocol, I have not taken over any controlled substance management, pending the results of ordered tests and/or consults.            Initial impression: Pending review of available data and ordered tests.  Meds   Current Outpatient Medications:    ferrous sulfate 325 (65 FE) MG EC tablet, Take 325 mg by mouth 3 (three) times daily with meals., Disp: , Rfl:    gabapentin (NEURONTIN) 300 MG capsule, Take 100 mg by mouth 3 (three) times daily as needed (pain). , Disp: , Rfl:    Misc Natural Products (GLUCOSAMINE CHOND COMPLEX/MSM PO), Take 1 tablet by mouth daily. (Patient not taking: Reported on 11/24/2019), Disp: , Rfl:    Multiple Vitamin (MULTIVITAMIN WITH MINERALS) TABS tablet, Take 1 tablet by mouth daily., Disp: , Rfl:    Omega-3 Fatty Acids (FISH OIL) 1000 MG CAPS, Take 1,000 mg by mouth daily., Disp: , Rfl:    simvastatin (ZOCOR) 10 MG tablet, Take 10 mg by mouth at  bedtime., Disp: , Rfl:    vitamin E 180 MG (400 UNITS) capsule, Take 400 Units by mouth daily., Disp: , Rfl:   Imaging Review  Lumbosacral Imaging: Lumbar DG 2-3 views: Results for orders placed during the hospital encounter of 12/03/19 DG Lumbar Spine 2-3 Views  Narrative CLINICAL DATA:  Surgery, elective. Additional history provided: L2-5 pos pedicle screws. Provided fluoroscopy time 2 minutes, 24 seconds (96.05  mGy).  EXAM: LUMBAR SPINE - 2-3 VIEW; DG C-ARM 1-60 MIN  COMPARISON:  Fluoroscopic images of the lumbar spine earlier the same day 12/03/2019. Lumbar spine MRI 10/14/2019.  FINDINGS: PA and lateral view intraoperative fluoroscopic images of the lumbar spine are submitted, 6 images total. The final images taken at 5:03 p.m. demonstrate a posterior spinal fusion construct spanning what are presumably the L2-L5 levels (the exact levels are difficult to ascertain given the field of view). The posterior spinal fusion construct consists of bilateral pedicle screws and vertical interconnecting rods. Interbody devices are also present at what are presumed to be the L2-L3, L3-L4 and L4-L5 levels.  IMPRESSION: 6 intraoperative fluoroscopic images of the lumbar spine, as described.   Electronically Signed By: Kellie Simmering DO On: 12/03/2019 18:12  Complexity Note: Imaging results reviewed.                         ROS  Cardiovascular: {Hx; Cardiovascular History:210120525} Pulmonary or Respiratory: {Hx; Pumonary and/or Respiratory History:210120523} Neurological: {Hx; Neurological:210120504} Psychological-Psychiatric: {Hx; Psychological-Psychiatric History:210120512} Gastrointestinal: {Hx; Gastrointestinal:210120527} Genitourinary: {Hx; Genitourinary:210120506} Hematological: {Hx; Hematological:210120510} Endocrine: {Hx; Endocrine history:210120509} Rheumatologic: {Hx; Rheumatological:210120530} Musculoskeletal: {Hx; Musculoskeletal:210120528} Work History: {Hx; Work history:210120514}  Allergies  Mr. Auriemma is allergic to bee venom.  Laboratory Chemistry Profile   Renal Lab Results  Component Value Date   BUN 6 (L) 12/01/2019   CREATININE 1.03 12/01/2019   GFRAA >60 09/09/2019   GFRNONAA >60 12/01/2019   PROTEINUR 30 (A) 09/22/2018     Electrolytes Lab Results  Component Value Date   NA 141 12/01/2019   K 3.6 12/01/2019   CL 103 12/01/2019   CALCIUM 9.7 12/01/2019      Hepatic Lab Results  Component Value Date   AST 24 12/01/2019   ALT 19 12/01/2019   ALBUMIN 4.2 12/01/2019   ALKPHOS 92 12/01/2019     ID Lab Results  Component Value Date   SARSCOV2NAA NEGATIVE 12/01/2019   STAPHAUREUS NEGATIVE 12/01/2019   MRSAPCR NEGATIVE 12/01/2019     Bone No results found for: "VD25OH", "VD125OH2TOT", "OJ5009FG1", "WE9937JI9", "25OHVITD1", "25OHVITD2", "67ELFYBO1", "TESTOFREE", "TESTOSTERONE"   Endocrine Lab Results  Component Value Date   GLUCOSE 96 12/01/2019   GLUCOSEU NEGATIVE 09/22/2018   TSH 1.741 09/24/2019     Neuropathy Lab Results  Component Value Date   BPZWCHEN27 782 09/24/2019   FOLATE 21.8 09/24/2019     CNS No results found for: "COLORCSF", "APPEARCSF", "RBCCOUNTCSF", "WBCCSF", "POLYSCSF", "LYMPHSCSF", "EOSCSF", "PROTEINCSF", "GLUCCSF", "JCVIRUS", "CSFOLI", "IGGCSF", "LABACHR", "ACETBL"   Inflammation (CRP: Acute  ESR: Chronic) No results found for: "CRP", "ESRSEDRATE", "LATICACIDVEN"   Rheumatology No results found for: "RF", "ANA", "LABURIC", "URICUR", "LYMEIGGIGMAB", "LYMEABIGMQN", "HLAB27"   Coagulation Lab Results  Component Value Date   INR 1.1 09/22/2018   LABPROT 14.0 09/22/2018   APTT 31 09/22/2018   PLT 267 12/01/2019     Cardiovascular Lab Results  Component Value Date   HGB 13.4 12/01/2019   HCT 39.4 12/01/2019     Screening Lab Results  Component Value Date   SARSCOV2NAA  NEGATIVE 12/01/2019   COVIDSOURCE NASOPHARYNGEAL 09/26/2018   STAPHAUREUS NEGATIVE 12/01/2019   MRSAPCR NEGATIVE 12/01/2019     Cancer No results found for: "CEA", "CA125", "LABCA2"   Allergens No results found for: "ALMOND", "APPLE", "ASPARAGUS", "AVOCADO", "BANANA", "BARLEY", "BASIL", "BAYLEAF", "GREENBEAN", "LIMABEAN", "WHITEBEAN", "BEEFIGE", "REDBEET", "BLUEBERRY", "BROCCOLI", "CABBAGE", "MELON", "CARROT", "CASEIN", "CASHEWNUT", "CAULIFLOWER", "CELERY"     Note: Lab results reviewed.  [place]  Drug: Mr. Cohen  reports  no history of drug use. Alcohol:  reports that he does not currently use alcohol. Tobacco:  reports that he has been smoking cigarettes. He has never used smokeless tobacco. Medical:  has a past medical history of Hepatitis. Family: family history is not on file.  Past Surgical History:  Procedure Laterality Date   ANTERIOR LAT LUMBAR FUSION Left 12/03/2019   Procedure: Left Lumbar two-threeLumbar three-four Lumbar four-five Anterior lateral lumbar interbody fusion with percutaneous pedicle screw fixation;  Surgeon: Erline Levine, MD;  Location: [place];  Service: Neurosurgery;  Laterality: Left;   LUMBAR PERCUTANEOUS PEDICLE SCREW 3 LEVEL N/A 12/03/2019   Procedure: LUMBAR PERCUTANEOUS PEDICLE SCREW Fixation Lumbar Two-Three/Lumbar Three-Four/ Lumbar Four-Five;  Surgeon: Erline Levine, MD;  Location: [place];  Service: Neurosurgery;  Laterality: N/A;   MOHS SURGERY  03/2018   TONSILLECTOMY     removed as a child   TOTAL HIP ARTHROPLASTY Left 09/30/2018   Procedure: LEFT TOTAL HIP ARTHROPLASTY ANTERIOR APPROACH;  Surgeon: Melrose Nakayama, MD;  Location: [facility];  Service: Orthopedics;  Laterality: Left;   Active Ambulatory Problems    Diagnosis Date Noted   Primary localized osteoarthritis of left hip 09/30/2018   Primary osteoarthritis of left hip 09/30/2018   Abnormal SPEP 09/24/2019   Normocytic anemia 09/24/2019   Lumbar scoliosis 12/03/2019   Anxiety 07/08/2015   Centrilobular emphysema ([place]) 05/09/2018   Coronary artery disease involving native coronary artery of native heart without angina pectoris 11/20/2019   Erectile dysfunction 07/08/2015   Essential hypertension 12/22/2020   H/O alcohol abuse 07/08/2015   History of 2019 novel coronavirus disease (COVID-19) 12/22/2020   History of hepatitis C 11/20/2019   History of tobacco use 07/08/2015   Intervertebral disc disorder with radiculopathy of lumbar region 02/28/2017   Lower urinary tract symptoms (LUTS) 12/22/2020   Left hip  pain 03/26/2017   Neuropathic pain of both feet 06/27/2021   Non-seasonal allergic rhinitis 12/14/2016   Screening for malignant neoplasm of respiratory organ 11/07/2017   Degenerative lumbar spinal stenosis 08/06/2021   Chronic pain syndrome 08/06/2021   Pharmacologic therapy 08/06/2021   Disorder of skeletal system 08/06/2021   Problems influencing health status 08/06/2021   Resolved Ambulatory Problems    Diagnosis Date Noted   No Resolved Ambulatory Problems   Past Medical History:  Diagnosis Date   Hepatitis    Constitutional Exam  General appearance: Well nourished, well developed, and well hydrated. In no apparent acute distress There were no vitals filed for this visit. BMI Assessment: Estimated body mass index is 28.25 kg/m as calculated from the following:   Height as of 12/03/19: $RemoveBef'6\' 2"'NDToXWDFiR$  (1.88 m).   Weight as of 12/03/19: 220 lb (99.8 kg).  BMI interpretation table: BMI level Category Range association with higher incidence of chronic pain  <18 kg/m2 Underweight   18.5-24.9 kg/m2 Ideal body weight   25-29.9 kg/m2 Overweight Increased incidence by 20%  30-34.9 kg/m2 Obese (Class I) Increased incidence by 68%  35-39.9 kg/m2 Severe obesity (Class II) Increased incidence by 136%  >40 kg/m2 Extreme obesity (Class  III) Increased incidence by 254%   Patient's current BMI Ideal Body weight  There is no height or weight on file to calculate BMI. Patient weight not recorded   BMI Readings from Last 4 Encounters:  12/03/19 28.25 kg/m  12/01/19 28.71 kg/m  09/24/19 28.18 kg/m  09/09/19 28.38 kg/m   Wt Readings from Last 4 Encounters:  12/03/19 220 lb (99.8 kg)  12/01/19 223 lb 9.6 oz (101.4 kg)  09/24/19 219 lb 7.5 oz (99.6 kg)  09/09/19 221 lb 0.2 oz (100.3 kg)    Psych/Mental status: Alert, oriented x 3 (person, place, & time)       Eyes: PERLA Respiratory: No evidence of acute respiratory distress  Assessment  Primary Diagnosis & Pertinent Problem List: The  primary encounter diagnosis was Chronic pain syndrome. Diagnoses of Pharmacologic therapy, Disorder of skeletal system, and Problems influencing health status were also pertinent to this visit.  Visit Diagnosis (New problems to examiner): 1. Chronic pain syndrome   2. Pharmacologic therapy   3. Disorder of skeletal system   4. Problems influencing health status    Plan of Care (Initial workup plan)  Note: Mr. Shon was reminded that as per protocol, today's visit has been an evaluation only. We have not taken over the patient's controlled substance management.  Problem-specific plan: No problem-specific Assessment & Plan notes found for this encounter.  Lab Orders  No laboratory test(s) ordered today   Imaging Orders  No imaging studies ordered today   Referral Orders  No referral(s) requested today   Procedure Orders    No procedure(s) ordered today   Pharmacotherapy (current): Medications ordered:  No orders of the defined types were placed in this encounter.  Medications administered during this visit: Harrold Fitchett. Rhodes had no medications administered during this visit.   Pharmacological management options:  Opioid Analgesics: The patient was informed that there is no guarantee that he would be a candidate for opioid analgesics. The decision will be made following CDC guidelines. This decision will be based on the results of diagnostic studies, as well as Mr. Demchak's risk profile.   Membrane stabilizer: To be determined at a later time  Muscle relaxant: To be determined at a later time  NSAID: To be determined at a later time  Other analgesic(s): To be determined at a later time   Interventional management options: Mr. Stailey was informed that there is no guarantee that he would be a candidate for interventional therapies. The decision will be based on the results of diagnostic studies, as well as Mr. Santa's risk profile.  Procedure(s) under consideration:  Pending  results of ordered studies      Interventional Therapies  Risk  Complexity Considerations:   Estimated body mass index is 28.25 kg/m as calculated from the following:   Height as of 12/03/19: $RemoveBef'6\' 2"'zDRxTiSnwt$  (1.88 m).   Weight as of 12/03/19: 220 lb (99.8 kg). WNL   Planned  Pending:   Pending further evaluation   Under consideration:   ***   Completed:   None at this time   Completed by other providers:   None at this time   Therapeutic  Palliative (PRN) options:   None established      Provider-requested follow-up: No follow-ups on file.  Future Appointments  Date Time Provider [place]  08/07/2021  9:00 AM Milinda Pointer, MD [facility] None    Note by: Gaspar Cola, MD Date: 08/07/2021; Time: 4:22 PM

## 2021-08-07 ENCOUNTER — Ambulatory Visit: Payer: Federal, State, Local not specified - PPO | Admitting: Pain Medicine

## 2021-08-07 DIAGNOSIS — Z79899 Other long term (current) drug therapy: Secondary | ICD-10-CM

## 2021-08-07 DIAGNOSIS — G894 Chronic pain syndrome: Secondary | ICD-10-CM

## 2021-08-07 DIAGNOSIS — Z789 Other specified health status: Secondary | ICD-10-CM

## 2021-08-07 DIAGNOSIS — M899 Disorder of bone, unspecified: Secondary | ICD-10-CM

## 2021-09-10 NOTE — Progress Notes (Unsigned)
Patient: Jonathan Stephens  Service Category: E/M  Provider: Gaspar Cola, MD  DOB: 1960-05-11  DOS: 09/11/2021  Referring Provider: Simona Huh, NP  MRN: [id number]  Setting: Ambulatory outpatient  PCP: Barbaraann Boys, MD  Type: New Patient  Specialty: Interventional Pain Management    Location: Office  Delivery: Face-to-face     Primary Reason(s) for Visit: Encounter for initial evaluation of one or more chronic problems (new to examiner) potentially causing chronic pain, and posing a threat to normal musculoskeletal function. (Level of risk: High) CC: No chief complaint on file.  HPI  Jonathan Stephens is a 61 y.o. year old, male patient, who comes for the first time to our practice referred by Simona Huh, NP for our initial evaluation of his chronic pain. He has Primary localized osteoarthritis of left hip; Primary osteoarthritis of left hip; Abnormal SPEP; Lumbar scoliosis; Anxiety; Centrilobular emphysema ([place]); Coronary artery disease involving native coronary artery of native heart without angina pectoris; Erectile dysfunction; Essential hypertension; H/O alcohol abuse; History of 2019 novel coronavirus disease (COVID-19); History of hepatitis C; History of tobacco use; Intervertebral disc disorder with radiculopathy of lumbar region; Lower urinary tract symptoms (LUTS); Left hip pain; Neuropathic pain of both feet; Non-seasonal allergic rhinitis; Screening for malignant neoplasm of respiratory organ; Degenerative lumbar spinal stenosis; Chronic pain syndrome; Pharmacologic therapy; Disorder of skeletal system; and Problems influencing health status on their problem list. Today he comes in for evaluation of his No chief complaint on file.  Pain Assessment: Location:     Radiating:   Onset:   Duration:   Quality:   Severity:  /10 (subjective, self-reported pain score)  Effect on ADL:   Timing:   Modifying factors:   BP:    HR:    Onset and Duration: {Hx; Onset and  Duration:210120511} Cause of pain: {Hx; Cause:210120521} Severity: {Pain Severity:210120502} Timing: {Symptoms; Timing:210120501} Aggravating Factors: {Causes; Aggravating pain factors:210120507} Alleviating Factors: {Causes; Alleviating Factors:210120500} Associated Problems: {Hx; Associated problems:210120515} Quality of Pain: {Hx; Symptom quality or Descriptor:210120531} Previous Examinations or Tests: {Hx; Previous examinations or test:210120529} Previous Treatments: {Hx; Previous Treatment:210120503}  ***  Today I took the time to provide the patient with information regarding my pain practice. The patient was informed that my practice is divided into two sections: an interventional pain management section, as well as a completely separate and distinct medication management section. I explained that I have procedure days for my interventional therapies, and evaluation days for follow-ups and medication management. Because of the amount of documentation required during both, they are kept separated. This means that there is the possibility that he may be scheduled for a procedure on one day, and medication management the next. I have also informed him that because of staffing and facility limitations, I no longer take patients for medication management only. To illustrate the reasons for this, I gave the patient the example of surgeons, and how inappropriate it would be to refer a patient to his/her care, just to write for the post-surgical antibiotics on a surgery done by a different surgeon.   Because interventional pain management is my board-certified specialty, the patient was informed that joining my practice means that they are open to any and all interventional therapies. I made it clear that this does not mean that they will be forced to have any procedures done. What this means is that I believe interventional therapies to be essential part of the diagnosis and proper management of  chronic pain conditions. Therefore, patients  not interested in these interventional alternatives will be better served under the care of a different practitioner.  The patient was also made aware of my Comprehensive Pain Management Safety Guidelines where by joining my practice, they limit all of their nerve blocks and joint injections to those done by our practice, for as long as we are retained to manage their care.   Historic Controlled Substance Pharmacotherapy Review  PMP and historical list of controlled substances: ***  Current opioid analgesics:   *** MME/day: *** mg/day  Historical Monitoring: The patient  reports no history of drug use. List of prior UDS Testing: No results found for: "MDMA", "COCAINSCRNUR", "PCPSCRNUR", "PCPQUANT", "CANNABQUANT", "THCU", "ETH", "CBDTHCR", "D8THCCBX", "D9THCCBX" Historical Background Evaluation: [place] PMP: PDMP reviewed during this encounter. Review of the past 80-month conducted.             PMP NARX Score Report:  Narcotic: 351 Sedative: 140 Stimulant: 000 [place] Department of public safety, offender search: (Editor, commissioningInformation) Non-contributory Risk Assessment Profile: Aberrant behavior: None observed or detected today Risk factors for fatal opioid overdose: None identified today PMP NARX Overdose Risk Score: 160 Fatal overdose hazard ratio (HR): Calculation deferred Non-fatal overdose hazard ratio (HR): Calculation deferred Risk of opioid abuse or dependence: 0.7-3.0% with doses ? 36 MME/day and 6.1-26% with doses ? 120 MME/day. Substance use disorder (SUD) risk level: See below Personal History of Substance Abuse (SUD-Substance use disorder):  Alcohol:    Illegal Drugs:    Rx Drugs:    ORT Risk Level calculation:    ORT Scoring interpretation table:  Score <3 = Low Risk for SUD  Score between 4-7 = Moderate Risk for SUD  Score >8 = High Risk for Opioid Abuse   PHQ-2 Depression Scale:  Total score:    PHQ-2 Scoring interpretation  table: (Score and probability of major depressive disorder)  Score 0 = No depression  Score 1 = 15.4% Probability  Score 2 = 21.1% Probability  Score 3 = 38.4% Probability  Score 4 = 45.5% Probability  Score 5 = 56.4% Probability  Score 6 = 78.6% Probability   PHQ-9 Depression Scale:  Total score:    PHQ-9 Scoring interpretation table:  Score 0-4 = No depression  Score 5-9 = Mild depression  Score 10-14 = Moderate depression  Score 15-19 = Moderately severe depression  Score 20-27 = Severe depression (2.4 times higher risk of SUD and 2.89 times higher risk of overuse)   Pharmacologic Plan: As per protocol, I have not taken over any controlled substance management, pending the results of ordered tests and/or consults.            Initial impression: Pending review of available data and ordered tests.  Meds   Current Outpatient Medications:    ferrous sulfate 325 (65 FE) MG EC tablet, Take 325 mg by mouth 3 (three) times daily with meals., Disp: , Rfl:    gabapentin (NEURONTIN) 300 MG capsule, Take 100 mg by mouth 3 (three) times daily as needed (pain). , Disp: , Rfl:    Misc Natural Products (GLUCOSAMINE CHOND COMPLEX/MSM PO), Take 1 tablet by mouth daily. (Patient not taking: Reported on 11/24/2019), Disp: , Rfl:    Multiple Vitamin (MULTIVITAMIN WITH MINERALS) TABS tablet, Take 1 tablet by mouth daily., Disp: , Rfl:    Omega-3 Fatty Acids (FISH OIL) 1000 MG CAPS, Take 1,000 mg by mouth daily., Disp: , Rfl:    simvastatin (ZOCOR) 10 MG tablet, Take 10 mg by mouth at bedtime., Disp: ,  Rfl:    vitamin E 180 MG (400 UNITS) capsule, Take 400 Units by mouth daily., Disp: , Rfl:   Imaging Review  Cervical Imaging: Cervical MR wo contrast: No results found for this or any previous visit.  Cervical MR wo contrast: No valid procedures specified. Cervical MR w/wo contrast: No results found for this or any previous visit.  Cervical MR w contrast: No results found for this or any previous  visit.  Cervical CT wo contrast: No results found for this or any previous visit.  Cervical CT w/wo contrast: No results found for this or any previous visit.  Cervical CT w/wo contrast: No results found for this or any previous visit.  Cervical CT w contrast: No results found for this or any previous visit.  Cervical CT outside: No results found for this or any previous visit.  Cervical DG 1 view: No results found for this or any previous visit.  Cervical DG 2-3 views: No results found for this or any previous visit.  Cervical DG F/E views: No results found for this or any previous visit.  Cervical DG 2-3 clearing views: No results found for this or any previous visit.  Cervical DG Bending/F/E views: No results found for this or any previous visit.  Cervical DG complete: No results found for this or any previous visit.  Cervical DG Myelogram views: No results found for this or any previous visit.  Cervical DG Myelogram views: No results found for this or any previous visit.  Cervical Discogram views: No results found for this or any previous visit.   Shoulder Imaging: Shoulder-R MR w contrast: No results found for this or any previous visit.  Shoulder-L MR w contrast: No results found for this or any previous visit.  Shoulder-R MR w/wo contrast: No results found for this or any previous visit.  Shoulder-L MR w/wo contrast: No results found for this or any previous visit.  Shoulder-R MR wo contrast: No results found for this or any previous visit.  Shoulder-L MR wo contrast: No results found for this or any previous visit.  Shoulder-R CT w contrast: No results found for this or any previous visit.  Shoulder-L CT w contrast: No results found for this or any previous visit.  Shoulder-R CT w/wo contrast: No results found for this or any previous visit.  Shoulder-L CT w/wo contrast: No results found for this or any previous visit.  Shoulder-R CT wo contrast: No results  found for this or any previous visit.  Shoulder-L CT wo contrast: No results found for this or any previous visit.  Shoulder-R DG Arthrogram: No results found for this or any previous visit.  Shoulder-L DG Arthrogram: No results found for this or any previous visit.  Shoulder-R DG 1 view: No results found for this or any previous visit.  Shoulder-L DG 1 view: No results found for this or any previous visit.  Shoulder-R DG: No results found for this or any previous visit.  Shoulder-L DG: No results found for this or any previous visit.   Thoracic Imaging: Thoracic MR wo contrast: No results found for this or any previous visit.  Thoracic MR wo contrast: No valid procedures specified. Thoracic MR w/wo contrast: No results found for this or any previous visit.  Thoracic MR w contrast: No results found for this or any previous visit.  Thoracic CT wo contrast: No results found for this or any previous visit.  Thoracic CT w/wo contrast: No results found for this or any  previous visit.  Thoracic CT w/wo contrast: No results found for this or any previous visit.  Thoracic CT w contrast: No results found for this or any previous visit.  Thoracic DG 2-3 views: No results found for this or any previous visit.  Thoracic DG 4 views: No results found for this or any previous visit.  Thoracic DG: No results found for this or any previous visit.  Thoracic DG w/swimmers view: No results found for this or any previous visit.  Thoracic DG Myelogram views: No results found for this or any previous visit.  Thoracic DG Myelogram views: No results found for this or any previous visit.   Lumbosacral Imaging: Lumbar MR wo contrast: No results found for this or any previous visit.  Lumbar MR wo contrast: No valid procedures specified. Lumbar MR w/wo contrast: No results found for this or any previous visit.  Lumbar MR w/wo contrast: No results found for this or any previous visit.  Lumbar  MR w contrast: No results found for this or any previous visit.  Lumbar CT wo contrast: No results found for this or any previous visit.  Lumbar CT w/wo contrast: No results found for this or any previous visit.  Lumbar CT w/wo contrast: No results found for this or any previous visit.  Lumbar CT w contrast: No results found for this or any previous visit.  Lumbar DG 1V: No results found for this or any previous visit.  Lumbar DG 1V (Clearing): No results found for this or any previous visit.  Lumbar DG 2-3V (Clearing): No results found for this or any previous visit.  Lumbar DG 2-3 views: Results for orders placed during the hospital encounter of 12/03/19  DG Lumbar Spine 2-3 Views  Narrative CLINICAL DATA:  Surgery, elective. Additional history provided: L2-5 pos pedicle screws. Provided fluoroscopy time 2 minutes, 24 seconds (96.05 mGy).  EXAM: LUMBAR SPINE - 2-3 VIEW; DG C-ARM 1-60 MIN  COMPARISON:  Fluoroscopic images of the lumbar spine earlier the same day 12/03/2019. Lumbar spine MRI 10/14/2019.  FINDINGS: PA and lateral view intraoperative fluoroscopic images of the lumbar spine are submitted, 6 images total. The final images taken at 5:03 p.m. demonstrate a posterior spinal fusion construct spanning what are presumably the L2-L5 levels (the exact levels are difficult to ascertain given the field of view). The posterior spinal fusion construct consists of bilateral pedicle screws and vertical interconnecting rods. Interbody devices are also present at what are presumed to be the L2-L3, L3-L4 and L4-L5 levels.  IMPRESSION: 6 intraoperative fluoroscopic images of the lumbar spine, as described.   Electronically Signed By: Kellie Simmering DO On: 12/03/2019 18:12  Lumbar DG (Complete) 4+V: No results found for this or any previous visit.        Lumbar DG F/E views: No results found for this or any previous visit.        Lumbar DG Bending views: No results found  for this or any previous visit.        Lumbar DG Myelogram views: No results found for this or any previous visit.  Lumbar DG Myelogram: No results found for this or any previous visit.  Lumbar DG Myelogram: No results found for this or any previous visit.  Lumbar DG Myelogram: No results found for this or any previous visit.  Lumbar DG Myelogram Lumbosacral: No results found for this or any previous visit.  Lumbar DG Diskogram views: No results found for this or any previous visit.  Lumbar DG  Diskogram views: No results found for this or any previous visit.  Lumbar DG Epidurogram OP: No results found for this or any previous visit.  Lumbar DG Epidurogram IP: No valid procedures specified.  Sacroiliac Joint Imaging: Sacroiliac Joint DG: No results found for this or any previous visit.  Sacroiliac Joint MR w/wo contrast: No results found for this or any previous visit.  Sacroiliac Joint MR wo contrast: No results found for this or any previous visit.   Spine Imaging: Whole Spine DG Myelogram views: No results found for this or any previous visit.  Whole Spine MR Mets screen: No results found for this or any previous visit.  Whole Spine MR Mets screen: No results found for this or any previous visit.  Whole Spine MR w/wo: No results found for this or any previous visit.  MRA Spinal Canal w/ cm: No results found for this or any previous visit.  MRA Spinal Canal wo/ cm: No valid procedures specified. MRA Spinal Canal w/wo cm: No results found for this or any previous visit.  Spine Outside MR Films: No results found for this or any previous visit.  Spine Outside CT Films: No results found for this or any previous visit.  CT-Guided Biopsy: No results found for this or any previous visit.  CT-Guided Needle Placement: No results found for this or any previous visit.  DG Spine outside: No results found for this or any previous visit.  IR Spine outside: No results found for  this or any previous visit.  NM Spine outside: No results found for this or any previous visit.   Hip Imaging: Hip-R MR w contrast: No results found for this or any previous visit.  Hip-L MR w contrast: No results found for this or any previous visit.  Hip-R MR w/wo contrast: No results found for this or any previous visit.  Hip-L MR w/wo contrast: No results found for this or any previous visit.  Hip-R MR wo contrast: No results found for this or any previous visit.  Hip-L MR wo contrast: No results found for this or any previous visit.  Hip-R CT w contrast: No results found for this or any previous visit.  Hip-L CT w contrast: No results found for this or any previous visit.  Hip-R CT w/wo contrast: No results found for this or any previous visit.  Hip-L CT w/wo contrast: No results found for this or any previous visit.  Hip-R CT wo contrast: No results found for this or any previous visit.  Hip-L CT wo contrast: No results found for this or any previous visit.  Hip-R DG 2-3 views: No results found for this or any previous visit.  Hip-L DG 2-3 views: No results found for this or any previous visit.  Hip-R DG Arthrogram: No results found for this or any previous visit.  Hip-L DG Arthrogram: No results found for this or any previous visit.  Hip-B DG Bilateral: No results found for this or any previous visit.   Knee Imaging: Knee-R MR w contrast: No results found for this or any previous visit.  Knee-L MR w/o contrast: No results found for this or any previous visit.  Knee-R MR w/wo contrast: No results found for this or any previous visit.  Knee-L MR w/wo contrast: No results found for this or any previous visit.  Knee-R MR wo contrast: No results found for this or any previous visit.  Knee-L MR wo contrast: No results found for this or any previous visit.  Knee-R CT w contrast: No results found for this or any previous visit.  Knee-L CT w contrast: No results  found for this or any previous visit.  Knee-R CT w/wo contrast: No results found for this or any previous visit.  Knee-L CT w/wo contrast: No results found for this or any previous visit.  Knee-R CT wo contrast: No results found for this or any previous visit.  Knee-L CT wo contrast: No results found for this or any previous visit.  Knee-R DG 1-2 views: No results found for this or any previous visit.  Knee-L DG 1-2 views: No results found for this or any previous visit.  Knee-R DG 3 views: No results found for this or any previous visit.  Knee-L DG 3 views: No results found for this or any previous visit.  Knee-R DG 4 views: No results found for this or any previous visit.  Knee-L DG 4 views: No results found for this or any previous visit.  Knee-R DG Arthrogram: No results found for this or any previous visit.  Knee-L DG Arthrogram: No results found for this or any previous visit.   Ankle Imaging: Ankle-R DG Complete: No results found for this or any previous visit.  Ankle-L DG Complete: No results found for this or any previous visit.   Foot Imaging: Foot-R DG Complete: No results found for this or any previous visit.  Foot-L DG Complete: No results found for this or any previous visit.   Elbow Imaging: Elbow-R DG Complete: No results found for this or any previous visit.  Elbow-L DG Complete: No results found for this or any previous visit.   Wrist Imaging: Wrist-R DG Complete: No results found for this or any previous visit.  Wrist-L DG Complete: No results found for this or any previous visit.   Hand Imaging: Hand-R DG Complete: No results found for this or any previous visit.  Hand-L DG Complete: No results found for this or any previous visit.   Complexity Note: Imaging results reviewed.                         ROS  Cardiovascular: {Hx; Cardiovascular History:210120525} Pulmonary or Respiratory: {Hx; Pumonary and/or Respiratory  History:210120523} Neurological: {Hx; Neurological:210120504} Psychological-Psychiatric: {Hx; Psychological-Psychiatric History:210120512} Gastrointestinal: {Hx; Gastrointestinal:210120527} Genitourinary: {Hx; Genitourinary:210120506} Hematological: {Hx; Hematological:210120510} Endocrine: {Hx; Endocrine history:210120509} Rheumatologic: {Hx; Rheumatological:210120530} Musculoskeletal: {Hx; Musculoskeletal:210120528} Work History: {Hx; Work history:210120514}  Allergies  Jonathan Stephens is allergic to bee venom.  Laboratory Chemistry Profile   Renal Lab Results  Component Value Date   BUN 6 (L) 12/01/2019   CREATININE 1.03 12/01/2019   GFRAA >60 09/09/2019   GFRNONAA >60 12/01/2019   PROTEINUR 30 (A) 09/22/2018     Electrolytes Lab Results  Component Value Date   NA 141 12/01/2019   K 3.6 12/01/2019   CL 103 12/01/2019   CALCIUM 9.7 12/01/2019     Hepatic Lab Results  Component Value Date   AST 24 12/01/2019   ALT 19 12/01/2019   ALBUMIN 4.2 12/01/2019   ALKPHOS 92 12/01/2019     ID Lab Results  Component Value Date   SARSCOV2NAA NEGATIVE 12/01/2019   STAPHAUREUS NEGATIVE 12/01/2019   MRSAPCR NEGATIVE 12/01/2019     Bone No results found for: "VD25OH", "VD125OH2TOT", "HW2993ZJ6", "RC7893YB0", "17PZWCHE5", "25OHVITD2", "27POEUMP5", "TESTOFREE", "TESTOSTERONE"   Endocrine Lab Results  Component Value Date   GLUCOSE 96 12/01/2019   GLUCOSEU NEGATIVE 09/22/2018   TSH 1.741 09/24/2019  Neuropathy Lab Results  Component Value Date   WTUUEKCM03 491 09/24/2019   FOLATE 21.8 09/24/2019     CNS No results found for: "COLORCSF", "APPEARCSF", "RBCCOUNTCSF", "WBCCSF", "POLYSCSF", "LYMPHSCSF", "EOSCSF", "PROTEINCSF", "GLUCCSF", "JCVIRUS", "CSFOLI", "IGGCSF", "LABACHR", "ACETBL"   Inflammation (CRP: Acute  ESR: Chronic) No results found for: "CRP", "ESRSEDRATE", "LATICACIDVEN"   Rheumatology No results found for: "RF", "ANA", "LABURIC", "URICUR", "LYMEIGGIGMAB",  "LYMEABIGMQN", "HLAB27"   Coagulation Lab Results  Component Value Date   INR 1.1 09/22/2018   LABPROT 14.0 09/22/2018   APTT 31 09/22/2018   PLT 267 12/01/2019     Cardiovascular Lab Results  Component Value Date   HGB 13.4 12/01/2019   HCT 39.4 12/01/2019     Screening Lab Results  Component Value Date   SARSCOV2NAA NEGATIVE 12/01/2019   COVIDSOURCE NASOPHARYNGEAL 09/26/2018   STAPHAUREUS NEGATIVE 12/01/2019   MRSAPCR NEGATIVE 12/01/2019     Cancer No results found for: "CEA", "CA125", "LABCA2"   Allergens No results found for: "ALMOND", "APPLE", "ASPARAGUS", "AVOCADO", "BANANA", "BARLEY", "BASIL", "BAYLEAF", "GREENBEAN", "LIMABEAN", "WHITEBEAN", "BEEFIGE", "REDBEET", "BLUEBERRY", "BROCCOLI", "CABBAGE", "MELON", "CARROT", "CASEIN", "CASHEWNUT", "CAULIFLOWER", "CELERY"     Note: Lab results reviewed.  [place]  Drug: Jonathan Stephens  reports no history of drug use. Alcohol:  reports that he does not currently use alcohol. Tobacco:  reports that he has been smoking cigarettes. He has never used smokeless tobacco. Medical:  has a past medical history of Hepatitis. Family: family history is not on file.  Past Surgical History:  Procedure Laterality Date   ANTERIOR LAT LUMBAR FUSION Left 12/03/2019   Procedure: Left Lumbar two-threeLumbar three-four Lumbar four-five Anterior lateral lumbar interbody fusion with percutaneous pedicle screw fixation;  Surgeon: Erline Levine, MD;  Location: [facility];  Service: Neurosurgery;  Laterality: Left;   LUMBAR PERCUTANEOUS PEDICLE SCREW 3 LEVEL N/A 12/03/2019   Procedure: LUMBAR PERCUTANEOUS PEDICLE SCREW Fixation Lumbar Two-Three/Lumbar Three-Four/ Lumbar Four-Five;  Surgeon: Erline Levine, MD;  Location: [facility];  Service: Neurosurgery;  Laterality: N/A;   MOHS SURGERY  03/2018   TONSILLECTOMY     removed as a child   TOTAL HIP ARTHROPLASTY Left 09/30/2018   Procedure: LEFT TOTAL HIP ARTHROPLASTY ANTERIOR APPROACH;  Surgeon: Melrose Nakayama, MD;   Location: [facility];  Service: Orthopedics;  Laterality: Left;   Active Ambulatory Problems    Diagnosis Date Noted   Primary localized osteoarthritis of left hip 09/30/2018   Primary osteoarthritis of left hip 09/30/2018   Abnormal SPEP 09/24/2019   Lumbar scoliosis 12/03/2019   Anxiety 07/08/2015   Centrilobular emphysema ([place]) 05/09/2018   Coronary artery disease involving native coronary artery of native heart without angina pectoris 11/20/2019   Erectile dysfunction 07/08/2015   Essential hypertension 12/22/2020   H/O alcohol abuse 07/08/2015   History of 2019 novel coronavirus disease (COVID-19) 12/22/2020   History of hepatitis C 11/20/2019   History of tobacco use 07/08/2015   Intervertebral disc disorder with radiculopathy of lumbar region 02/28/2017   Lower urinary tract symptoms (LUTS) 12/22/2020   Left hip pain 03/26/2017   Neuropathic pain of both feet 06/27/2021   Non-seasonal allergic rhinitis 12/14/2016   Screening for malignant neoplasm of respiratory organ 11/07/2017   Degenerative lumbar spinal stenosis 08/06/2021   Chronic pain syndrome 08/06/2021   Pharmacologic therapy 08/06/2021   Disorder of skeletal system 08/06/2021   Problems influencing health status 08/06/2021   Resolved Ambulatory Problems    Diagnosis Date Noted   Normocytic anemia 09/24/2019   Past Medical History:  Diagnosis Date  Hepatitis    Constitutional Exam  General appearance: Well nourished, well developed, and well hydrated. In no apparent acute distress There were no vitals filed for this visit. BMI Assessment: Estimated body mass index is 28.25 kg/m as calculated from the following:   Height as of 12/03/19: _0  (1.88 m).   Weight as of 12/03/19: 220 lb (99.8 kg).  BMI interpretation table: BMI level Category Range association with higher incidence of chronic pain  <18 kg/m2 Underweight   18.5-24.9 kg/m2 Ideal body weight   25-29.9 kg/m2 Overweight Increased incidence by 20%   30-34.9 kg/m2 Obese (Class I) Increased incidence by 68%  35-39.9 kg/m2 Severe obesity (Class II) Increased incidence by 136%  >40 kg/m2 Extreme obesity (Class III) Increased incidence by 254%   Patient's current BMI Ideal Body weight  There is no height or weight on file to calculate BMI. Patient weight not recorded   BMI Readings from Last 4 Encounters:  12/03/19 28.25 kg/m  12/01/19 28.71 kg/m  09/24/19 28.18 kg/m  09/09/19 28.38 kg/m   Wt Readings from Last 4 Encounters:  12/03/19 220 lb (99.8 kg)  12/01/19 223 lb 9.6 oz (101.4 kg)  09/24/19 219 lb 7.5 oz (99.6 kg)  09/09/19 221 lb 0.2 oz (100.3 kg)    Psych/Mental status: Alert, oriented x 3 (person, place, & time)       Eyes: PERLA Respiratory: No evidence of acute respiratory distress  Assessment  Primary Diagnosis & Pertinent Problem List: The primary encounter diagnosis was Chronic pain syndrome. Diagnoses of Pharmacologic therapy, Disorder of skeletal system, and Problems influencing health status were also pertinent to this visit.  Visit Diagnosis (New problems to examiner): 1. Chronic pain syndrome   2. Pharmacologic therapy   3. Disorder of skeletal system   4. Problems influencing health status    Plan of Care (Initial workup plan)  Note: Jonathan Stephens was reminded that as per protocol, today's visit has been an evaluation only. We have not taken over the patient's controlled substance management.  Problem-specific plan: No problem-specific Assessment & Plan notes found for this encounter.  Lab Orders  No laboratory test(s) ordered today   Imaging Orders  No imaging studies ordered today   Referral Orders  No referral(s) requested today   Procedure Orders    No procedure(s) ordered today   Pharmacotherapy (current): Medications ordered:  No orders of the defined types were placed in this encounter.  Medications administered during this visit: Jonathan Stephens. [place] had no medications administered  during this visit.   Pharmacological management options:  Opioid Analgesics: The patient was informed that there is no guarantee that he would be a candidate for opioid analgesics. The decision will be made following CDC guidelines. This decision will be based on the results of diagnostic studies, as well as Jonathan Stephens's risk profile.   Membrane stabilizer: To be determined at a later time  Muscle relaxant: To be determined at a later time  NSAID: To be determined at a later time  Other analgesic(s): To be determined at a later time   Interventional management options: Jonathan Stephens was informed that there is no guarantee that he would be a candidate for interventional therapies. The decision will be based on the results of diagnostic studies, as well as Jonathan Stephens's risk profile.  Procedure(s) under consideration:  Pending results of ordered studies      Interventional Therapies  Risk  Complexity Considerations:   Estimated body mass index is 28.25 kg/m as calculated  from the following:   Height as of 12/03/19: _0  (1.88 m).   Weight as of 12/03/19: 220 lb (99.8 kg). WNL   Planned  Pending:   Pending further evaluation   Under consideration:   ***   Completed:   None at this time   Completed by other providers:   None at this time   Therapeutic  Palliative (PRN) options:   None established      Provider-requested follow-up: No follow-ups on file.  Future Appointments  Date Time Provider [place]  09/11/2021 11:00 AM Milinda Pointer, MD [facility] None    Note by: Gaspar Cola, MD Date: 09/11/2021; Time: 7:28 PM

## 2021-09-11 ENCOUNTER — Encounter: Payer: Self-pay | Admitting: Pain Medicine

## 2021-09-11 ENCOUNTER — Ambulatory Visit: Payer: Federal, State, Local not specified - PPO | Attending: Pain Medicine | Admitting: Pain Medicine

## 2021-09-11 VITALS — BP 131/82 | HR 78 | Temp 97.3°F | Resp 14 | Ht 74.0 in | Wt 215.0 lb

## 2021-09-11 DIAGNOSIS — M51369 Other intervertebral disc degeneration, lumbar region without mention of lumbar back pain or lower extremity pain: Secondary | ICD-10-CM | POA: Insufficient documentation

## 2021-09-11 DIAGNOSIS — M899 Disorder of bone, unspecified: Secondary | ICD-10-CM | POA: Insufficient documentation

## 2021-09-11 DIAGNOSIS — G8929 Other chronic pain: Secondary | ICD-10-CM | POA: Diagnosis present

## 2021-09-11 DIAGNOSIS — Z79899 Other long term (current) drug therapy: Secondary | ICD-10-CM | POA: Diagnosis present

## 2021-09-11 DIAGNOSIS — M5136 Other intervertebral disc degeneration, lumbar region: Secondary | ICD-10-CM | POA: Diagnosis present

## 2021-09-11 DIAGNOSIS — M961 Postlaminectomy syndrome, not elsewhere classified: Secondary | ICD-10-CM | POA: Diagnosis present

## 2021-09-11 DIAGNOSIS — G894 Chronic pain syndrome: Secondary | ICD-10-CM | POA: Insufficient documentation

## 2021-09-11 DIAGNOSIS — R200 Anesthesia of skin: Secondary | ICD-10-CM | POA: Diagnosis present

## 2021-09-11 DIAGNOSIS — M79605 Pain in left leg: Secondary | ICD-10-CM | POA: Insufficient documentation

## 2021-09-11 DIAGNOSIS — R202 Paresthesia of skin: Secondary | ICD-10-CM | POA: Insufficient documentation

## 2021-09-11 DIAGNOSIS — R937 Abnormal findings on diagnostic imaging of other parts of musculoskeletal system: Secondary | ICD-10-CM | POA: Diagnosis present

## 2021-09-11 DIAGNOSIS — M5386 Other specified dorsopathies, lumbar region: Secondary | ICD-10-CM | POA: Insufficient documentation

## 2021-09-11 DIAGNOSIS — Z789 Other specified health status: Secondary | ICD-10-CM | POA: Diagnosis present

## 2021-09-11 DIAGNOSIS — M79604 Pain in right leg: Secondary | ICD-10-CM | POA: Diagnosis present

## 2021-09-11 DIAGNOSIS — M545 Low back pain, unspecified: Secondary | ICD-10-CM | POA: Insufficient documentation

## 2021-09-14 LAB — COMPLIANCE DRUG ANALYSIS, UR

## 2021-09-16 LAB — MAGNESIUM: Magnesium: 1.9 mg/dL (ref 1.6–2.3)

## 2021-09-16 LAB — 25-HYDROXY VITAMIN D LCMS D2+D3
25-Hydroxy, Vitamin D-2: <1 ng/mL
25-Hydroxy, Vitamin D-3: 59 ng/mL
25-Hydroxy, Vitamin D: 59 ng/mL

## 2021-09-16 LAB — COMP. METABOLIC PANEL (12)
AST: 15 IU/L (ref 0–40)
Albumin/Globulin Ratio: 1.2 (ref 1.2–2.2)
Albumin: 4.6 g/dL (ref 3.9–4.9)
Alkaline Phosphatase: 130 IU/L — ABNORMAL HIGH (ref 44–121)
BUN/Creatinine Ratio: 8 — ABNORMAL LOW (ref 10–24)
BUN: 7 mg/dL — ABNORMAL LOW (ref 8–27)
Bilirubin Total: 0.5 mg/dL (ref 0.0–1.2)
Calcium: 9.6 mg/dL (ref 8.6–10.2)
Chloride: 97 mmol/L (ref 96–106)
Creatinine, Ser: 0.89 mg/dL (ref 0.76–1.27)
Globulin, Total: 3.7 g/dL (ref 1.5–4.5)
Glucose: 124 mg/dL — ABNORMAL HIGH (ref 70–99)
Potassium: 2.9 mmol/L — ABNORMAL LOW (ref 3.5–5.2)
Sodium: 143 mmol/L (ref 134–144)
Total Protein: 8.3 g/dL (ref 6.0–8.5)
eGFR: 96 mL/min/{1.73_m2} (ref >59)

## 2021-09-16 LAB — C-REACTIVE PROTEIN: CRP: 9 mg/L (ref 0–10)

## 2021-09-16 LAB — SEDIMENTATION RATE: Sed Rate: 32 mm/hr — ABNORMAL HIGH (ref 0–30)

## 2021-09-16 LAB — VITAMIN B12: Vitamin B-12: 913 pg/mL (ref 232–1245)

## 2021-09-20 ENCOUNTER — Ambulatory Visit
Admission: RE | Admit: 2021-09-20 | Discharge: 2021-09-20 | Disposition: A | Discharge status: Home / Self Care | Payer: Federal, State, Local not specified - PPO | Source: Ambulatory Visit | Attending: Pain Medicine | Admitting: Pain Medicine

## 2021-09-20 DIAGNOSIS — M545 Low back pain, unspecified: Secondary | ICD-10-CM | POA: Diagnosis present

## 2021-09-20 DIAGNOSIS — G8929 Other chronic pain: Secondary | ICD-10-CM | POA: Diagnosis present

## 2021-09-20 DIAGNOSIS — M5386 Other specified dorsopathies, lumbar region: Secondary | ICD-10-CM | POA: Insufficient documentation

## 2021-09-20 DIAGNOSIS — M79605 Pain in left leg: Secondary | ICD-10-CM | POA: Insufficient documentation

## 2021-09-20 DIAGNOSIS — R200 Anesthesia of skin: Secondary | ICD-10-CM | POA: Diagnosis present

## 2021-09-20 DIAGNOSIS — M961 Postlaminectomy syndrome, not elsewhere classified: Secondary | ICD-10-CM | POA: Diagnosis present

## 2021-09-20 DIAGNOSIS — M5136 Other intervertebral disc degeneration, lumbar region: Secondary | ICD-10-CM | POA: Diagnosis present

## 2021-09-20 DIAGNOSIS — G894 Chronic pain syndrome: Secondary | ICD-10-CM | POA: Insufficient documentation

## 2021-09-20 DIAGNOSIS — R937 Abnormal findings on diagnostic imaging of other parts of musculoskeletal system: Secondary | ICD-10-CM | POA: Diagnosis present

## 2021-09-20 DIAGNOSIS — R202 Paresthesia of skin: Secondary | ICD-10-CM | POA: Insufficient documentation

## 2021-09-20 DIAGNOSIS — M79604 Pain in right leg: Secondary | ICD-10-CM | POA: Diagnosis present

## 2021-10-23 ENCOUNTER — Ambulatory Visit: Payer: Federal, State, Local not specified - PPO | Admitting: Pain Medicine

## 2021-10-25 ENCOUNTER — Ambulatory Visit: Payer: Federal, State, Local not specified - PPO | Admitting: Pain Medicine

## 2021-11-03 DIAGNOSIS — F1021 Alcohol dependence, in remission: Secondary | ICD-10-CM | POA: Insufficient documentation

## 2021-11-06 DIAGNOSIS — E876 Hypokalemia: Secondary | ICD-10-CM | POA: Insufficient documentation

## 2021-12-05 DIAGNOSIS — R892 Abnormal level of other drugs, medicaments and biological substances in specimens from other organs, systems and tissues: Secondary | ICD-10-CM | POA: Insufficient documentation

## 2021-12-05 NOTE — Progress Notes (Addendum)
PROVIDER NOTE: Information contained herein reflects review and annotations entered in association with encounter. Interpretation of such information and data should be left to medically-trained personnel. Information provided to patient can be located elsewhere in the medical record under "Patient Instructions". Document created using STT-dictation technology, any transcriptional errors that may result from process are unintentional.    Patient: Jonathan Stephens  Service Category: E/M  Provider: Gaspar Cola, MD  DOB: May 31, 1960  DOS: 12/06/2021  Referring Provider: Barbaraann Boys, MD  MRN: [id number]  Specialty: Interventional Pain Management  PCP: Barbaraann Boys, MD  Type: Established Patient  Setting: Ambulatory outpatient    Location: Office  Delivery: Face-to-face     Primary Reason(s) for Visit: Encounter for evaluation before starting new chronic pain management plan of care (Level of risk: moderate) CC: Back Pain  HPI  Jonathan Stephens is a 61 y.o. year old, male patient, who comes today for a follow-up evaluation to review the test results and decide on a treatment plan. He has Abnormal SPEP; Lumbar scoliosis; Anxiety; Centrilobular emphysema ([place]); Coronary artery disease involving native coronary artery of native heart without angina pectoris; Erectile dysfunction; Essential hypertension; H/O alcohol abuse; History of 2019 novel coronavirus disease (COVID-19); History of hepatitis C; History of tobacco use; Intervertebral disc disorder with radiculopathy of lumbar region; Lower urinary tract symptoms (LUTS); Neuropathic pain of feet (Bilateral); Non-seasonal allergic rhinitis; Screening for malignant neoplasm of respiratory organ; Degenerative lumbar spinal stenosis; Chronic pain syndrome; Pharmacologic therapy; Disorder of skeletal system; Problems influencing health status; Abnormal MRI, lumbar spine (02/28/2017 & 09/22/2021); Failed back surgical syndrome; Decreased range of motion of lumbar  spine; Chronic low back pain (1ry area of Pain) (Bilateral) (R>L) w/o sciatica; Chronic lower extremity pain (2ry area of Pain) (Bilateral) (L>R); Numbness and tingling of lower extremity (Bilateral); DDD (degenerative disc disease), lumbar; 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 ([place]); Low potassium syndrome; Abnormal drug screen (09/11/2021); Grade 1 Anterolisthesis of lumbar spine (L4/L5); Grade 1 Retrolisthesis of L3/L4; Greater trochanteric bursitis of hip (Right); Lumbosacral facet joint syndrome (Bilateral); Lumbar facet arthropathy (Multilevel) (Bilateral); Lumbar facet joint pain; Osteoarthritis of facet joint of lumbar spine; Spondylosis without myelopathy or radiculopathy, lumbosacral region; Discogenic low back pain; Chronic hip pain (Right); Chronic sacroiliac joint pain (Right); History of total hip replacement (Left); and Burning pain (feet) on their problem list. His primarily concern today is the Back Pain  Pain Assessment: Location: Lower, Right Back Radiating: down back of right leg to calve Onset: More than a month ago Duration:   Quality: Burning, Constant, Aching, Sore, Shooting Severity: 8 /10 (subjective, self-reported pain score)  Effect on ADL: prolonged walking and standing,bending,lifting Timing: Constant Modifying factors: "Nothing" ,rest BP: (!) 151/85  HR: 75  Jonathan Stephens comes in today for a follow-up visit after his initial evaluation on 09/11/2021. Today we went over the results of his tests. These were explained in "Layman's terms". During today's appointment we went over my diagnostic impression, as well as the proposed treatment plan.  Review of initial evaluation (09/11/2021): "According to the patient the primary area of pain is that of the lower back (Bilateral) (R>L).  He does have a history of a prior back surgery by Dr. Erline Levine.  He refers that prior to the surgery he was experiencing low back pain but he denies any lower extremity pain, numbness,  or weakness prior to the surgery.  He admits to having physical therapy (pool therapy) at [facility].  He has been going  there once a week for the past 6 months and he is still going.  He refers that it helps when he first gets out of the swimming pool, but then it quickly wears off.  He does admit to having had some fairly recent x-rays that were done immediately.  Due to the fact that he was having some persistent low back pain.  Other than that everything else that he has is from before the surgery.  The patient does admit to having had several nerve blocks done at the [facility] which did seem to help for several more hours, but he attained no long-term benefit from them and therefore he decided to have the surgery.   Completed by other providers:   Diagnostic/therapeutic left L2-3 TFESI x1 (03/04/2017) by Etheleen Mayhew, MD ([place])  [place] left IA hip injection x2 (04/12/2017, 05/08/2017) by Etheleen Mayhew, MD ([place])  [place] left L4-5 and L5-S1 TFESI x3 (05/29/2017, 09/20/2017, 12/12/2018) by Etheleen Mayhew, MD ([place])  Diagnostic/therapeutic right L4-5 and L5-S1 TESI x1 (06/26/2019)  by Etheleen Mayhew, MD ([place])  Therapeutic left total hip replacement (THR) x1 (09/30/2018) by P.  Dalldorf, MD  Therapeutic left L2-3, L3-4, L4-5 anterior lateral lumbar interbody fusion with percutaneous pedicle screw fixation (left) (12/03/2019) by Dr. Erline Levine   According to the patient the secondary area pain is that of the lower extremities (Bilateral) (R>L) (Changed about 1 mo/ago).  He describes the pain as going down the anterior aspect of the legs to the area of the knees and then from the knees down he has numbness and weakness.  He refers that all of this started after the surgery and the only thing that seems to help is the gabapentin.  He refers that this numbness and tingling affects the  top and bottom of his feet.  He denies any nerve conduction testing.  He denies any diabetes mellitus.  And he denies ever having had these symptoms prior to the surgery.  Pharmacotherapy: The patient indicates taking gabapentin 300 mg p.o. 3 times daily, Percocet 5/325 1 tab p.o. twice daily, and ibuprofen 800 mg 1 tablet p.o. twice daily.  Physical exam: The patient had difficulty standing up.  He indicated being unable to toe walk or heel walk secondary to numbness of his feet.  He was able to go through fairly adequate range of motion of his feet with flexion, extension, lateral bending and medial bending.  The patient was unable to stand up straight and attempts at doing so or performing a hyperextension maneuver were met with significant low back pain."  Today we have reviewed the ordered lab work and imaging.  Comprehensive metabolic panel showed elevated glucose level, BUN was decreased, BUN/creatinine ratio was decreased, potassium was decreased at 2.9 (normal: 3.5-5.2 mmol/L) and alkaline phosphatase was elevated at 130 (normal: 44-121 IU/L).  In addition, the patient's sed rate was elevated.  UDS was positive for carboxy THC.  (09/22/2021) LUMBAR MRI IMPRESSION: Mild spinal canal stenosis at L1-2, with left lateral recess narrowing and mild bilateral neural foraminal stenosis, slightly progressed from prior MRI in October 2021. No neural impingement. Postsurgical changes of anterior and posterior fusion from L2 through L5. Patent spinal canal neural foramina at L2-3 and L3-4, with mild residual lateral recess narrowing on the left at L2-3. Persistent grade 1 anterolisthesis at L4-5 along with disc bulging and facet degeneration results in moderate right and mild left lateral recess  stenosis and mild bilateral neural foraminal stenosis. Broad-based disc bulging with superimposed central disc protrusion at L5-S1, along with ligamentum flavum hypertrophy and bilateral facet arthropathy results in  moderate-severe right lateral recess stenosis and encroachment of the descending right S1 nerve root. Mild left lower recess and neural foraminal stenosis without impingement.  There 1 thing that we do not have our x-rays of the lumbar spine with bending views to evaluate the stability of the L4-5 anterolisthesis and L3-4 retrolisthesis.  We will be ordering those today.  In terms of the patient's nerve conduction test, he indicates that he went to see the neurologist to have the test done, but there are no reports on the electronic medical record.  Today we also took the time to go over his symptoms and he indicates that today his low back pain is still worse than the lower extremity pain however in terms of the lower extremity pain he refers that the right side is now worse than the left citing a new acute pain in the lateral aspect of his right hip right over the greater trochanter.  In the case of the low back pain it is still bilateral with the right being worse than the left.  In the case of the lower extremity pain it is also bilateral with the right being worse than the left.  In the case of the right lower extremity the main area of pain is at over the right greater trochanteric bursa.  This pain seems to run down to the knee and all ports towards the buttocks area.  He then asked denies any radicular symptoms below the knee.  He does, continue to complain of bilateral feet numbness and burning.  (12/06/2021) physical exam: Today the patient was able to toe walk and heel walk without any problems at all.  Lumbar flexion was positive for triggering pain in the flexion portion of the maneuver towards the lower back.  He describes that the pain is across the entire lower back.  He also demonstrated a "double stage recovery" (pathopneumonic of discogenic pain).  Lumbar lateral bending was positive for triggering pain towards the ipsilateral lower back, but no lower extremity pain.  Lumbar hyperextension  demonstrated significant decreased range of motion of the lumbar spine with some discomfort in the midline.  Lumbar hyperextension on rotation maneuver as well as the Kemp maneuver were positive bilaterally for ipsilateral facet joint arthralgia.  Straight leg raise was negative bilaterally for lower extremity pain however on the right side decreased range of motion could be observed with positive pain referred towards both sides of the lower back.  This occurred primarily on the active portion of the maneuver but when passive maneuver was done, he had no low back pain.  In the case of the left side again there was decreased range of motion with referred pain towards the right lower back (contralateral) and again the pain was primarily though in the active portion of the maneuver with absolutely no pain during the passive maneuver.  Again neither side referred pain down the legs.  Provocative Patrick maneuver was positive on the right side for pain in the right hip and SI joint area suggesting right hip and right sacroiliac joint arthralgias.  In the case of the left side the patient had had a prior total hip replacement and decreased range of motion compared to the right side was observed with some referred pain towards the right side of the lower back which decrease upon stabilizing  the pelvis.  Provocative distraction maneuver trigger pain on the right side of the lower back suggesting a right sided sacroiliac arthralgia.  Positive tenderness over the right trochanteric bursa  Impression: Acute right-sided trochanteric bursitis, bilateral lumbar facet syndrome  Mr. Cina was informed that I am currently unable to take patients for medication management. If interested, he will be offered a pharmacotherapy evaluation, including recommendations. Treatment plan offered is in alignment with my interventional pain management specialty.   Controlled Substance Pharmacotherapy Assessment REMS (Risk Evaluation and  Mitigation Strategy)  Opioid Analgesic: No chronic opioid analgesics therapy prescribed by our practice. Oxycodone/APAP 5/325, 1 tab p.o. twice daily (# 60) (last filled on 12/01/2021) (prescription written by Ane Payment, MD)  MME/day: 15 mg/day  Pill Count: None expected due to no prior prescriptions written by our practice. Ignatius Specking, RN  12/06/2021  8:55 AM  Sign when Signing Visit Safety precautions to be maintained throughout the outpatient stay will include: orient to surroundings, keep bed in low position, maintain call bell within reach at all times, provide assistance with transfer out of bed and ambulation.    Pharmacokinetics: Liberation and absorption (onset of action): WNL Distribution (time to peak effect): WNL Metabolism and excretion (duration of action): WNL         Pharmacodynamics: Desired effects: Analgesia: Jonathan Stephens reports >50% benefit. Functional ability: Patient reports that medication allows him to accomplish basic ADLs Clinically meaningful improvement in function (CMIF): Sustained CMIF goals met Perceived effectiveness: Described as relatively effective, allowing for increase in activities of daily living (ADL) Undesirable effects: Side-effects or Adverse reactions: None reported Monitoring: [place] PMP: PDMP reviewed during this encounter. Online review of the past 25-monthperiod previously conducted. Not applicable at this point since we have not taken over the patient's medication management yet. List of other Serum/Urine Drug Screening Test(s):  No results found for: "AMPHSCRSER", "BARBSCRSER", "BENZOSCRSER", "COCAINSCRSER", "COCAINSCRNUR", "PCPSCRSER", "THCSCRSER", "THCU", "CANNABQUANT", "OPIATESCRSER", "OXYSCRSER", "PROPOXSCRSER", "ETH", "CBDTHCR", "D8THCCBX", "D9THCCBX" List of all UDS test(s) done:  Lab Results  Component Value Date   SUMMARY Note 09/11/2021   Last UDS on record: Summary  Date Value Ref Range Status  09/11/2021 Note  Final     Comment:    ==================================================================== Compliance Drug Analysis, Ur ==================================================================== Test                             Result       Flag       Units  Drug Present and Declared for Prescription Verification   Noroxycodone                   869          EXPECTED   ng/mg creat   Noroxymorphone                 39           EXPECTED   ng/mg creat    Noroxycodone and noroxymorphone are expected metabolites of    oxycodone. Noroxymorphone is an expected metabolite of oxymorphone.    Sources of oxycodone and/or oxymorphone are scheduled prescription    medications.    Gabapentin                     PRESENT      EXPECTED   Mirtazapine  PRESENT      EXPECTED   Acetaminophen                  PRESENT      EXPECTED  Drug Present not Declared for Prescription Verification   Carboxy-THC                    15           UNEXPECTED ng/mg creat    Carboxy-THC is a metabolite of tetrahydrocannabinol (THC). Source of    THC is most commonly herbal marijuana or marijuana-based products,    but THC is also present in a scheduled prescription medication.    Trace amounts of THC can be present in hemp and cannabidiol (CBD)    products. This test is not intended to distinguish between delta-9-    tetrahydrocannabinol, the predominant form of THC in most herbal or    marijuana-based products, and delta-8-tetrahydrocannabinol.    Duloxetine                     PRESENT      UNEXPECTED   Trazodone                      PRESENT      UNEXPECTED   1,3 chlorophenyl piperazine    PRESENT      UNEXPECTED    1,3-chlorophenyl piperazine is an expected metabolite of trazodone.    Ibuprofen                      PRESENT      UNEXPECTED  Drug Absent but Declared for Prescription Verification   Oxycodone                      Not Detected UNEXPECTED ng/mg creat    Oxycodone is almost always present in patients  taking this drug    consistently.  Absence of oxycodone could be due to lapse of time    since the last dose or unusual pharmacokinetics (rapid metabolism).  ==================================================================== Test                      Result    Flag   Units      Ref Range   Creatinine              164              mg/dL      >=20 ==================================================================== Declared Medications:  The flagging and interpretation on this report are based on the  following declared medications.  Unexpected results may arise from  inaccuracies in the declared medications.   **Note: The testing scope of this panel includes these medications:   Gabapentin (Neurontin)  Mirtazapine (Remeron)  Oxycodone (Percocet)   **Note: The testing scope of this panel does not include small to  moderate amounts of these reported medications:   Acetaminophen (Percocet)   **Note: The testing scope of this panel does not include the  following reported medications:   Amlodipine (Norvasc)  Atorvastatin (Lipitor)  Fish Oil  Iron  Multivitamin  Simvastatin (Zocor)  Vitamin E ==================================================================== For clinical consultation, please call [phone]. ====================================================================    UDS interpretation: No unexpected findings.          Medication Assessment Form: Not applicable. No opioids. Treatment compliance: Not applicable Risk Assessment Profile: Aberrant behavior: See initial evaluations. None observed or  detected today Comorbid factors increasing risk of overdose: See initial evaluation. No additional risks detected today Opioid risk tool (ORT):     12/06/2021    9:00 AM  Opioid Risk   Alcohol 0  Illegal Drugs 0  Rx Drugs 0  Age between 16-45 years  0  Psychological Disease 2  ADD Negative  OCD Negative  Bipolar Negative  Depression 1  Opioid Risk Tool  Scoring 3  Opioid Risk Interpretation Low Risk    ORT Scoring interpretation table:  Score <3 = Low Risk for SUD  Score between 4-7 = Moderate Risk for SUD  Score >8 = High Risk for Opioid Abuse   Risk of substance use disorder (SUD): Low  Risk Mitigation Strategies:  Patient opioid safety counseling: No controlled substances prescribed. Patient-Prescriber Agreement (PPA): No agreement signed.  Controlled substance notification to other providers: None required. No opioid therapy.  Pharmacologic Plan: Non-opioid analgesic therapy offered. Interventional alternatives discussed.             Laboratory Chemistry Profile   Renal Lab Results  Component Value Date   BUN 7 (L) 09/11/2021   CREATININE 0.89 09/11/2021   BCR 8 (L) 09/11/2021   GFRAA >60 09/09/2019   GFRNONAA >60 12/01/2019   PROTEINUR 30 (A) 09/22/2018     Electrolytes Lab Results  Component Value Date   NA 143 09/11/2021   K 2.9 (L) 09/11/2021   CL 97 09/11/2021   CALCIUM 9.6 09/11/2021   MG 1.9 09/11/2021     Hepatic Lab Results  Component Value Date   AST 15 09/11/2021   ALT 19 12/01/2019   ALBUMIN 4.6 09/11/2021   ALKPHOS 130 (H) 09/11/2021     ID Lab Results  Component Value Date   SARSCOV2NAA NEGATIVE 12/01/2019   STAPHAUREUS NEGATIVE 12/01/2019   MRSAPCR NEGATIVE 12/01/2019     Bone Lab Results  Component Value Date   25OHVITD1 59 09/11/2021   25OHVITD2 <1.0 09/11/2021   25OHVITD3 59 09/11/2021     Endocrine Lab Results  Component Value Date   GLUCOSE 124 (H) 09/11/2021   GLUCOSEU NEGATIVE 09/22/2018   TSH 1.741 09/24/2019     Neuropathy Lab Results  Component Value Date   VITAMINB12 913 09/11/2021   FOLATE 21.8 09/24/2019     CNS No results found for: "COLORCSF", "APPEARCSF", "RBCCOUNTCSF", "WBCCSF", "POLYSCSF", "LYMPHSCSF", "EOSCSF", "PROTEINCSF", "GLUCCSF", "JCVIRUS", "CSFOLI", "IGGCSF", "LABACHR", "ACETBL"   Inflammation (CRP: Acute  ESR: Chronic) Lab Results   Component Value Date   CRP 9 09/11/2021   ESRSEDRATE 32 (H) 09/11/2021     Rheumatology No results found for: "RF", "ANA", "LABURIC", "URICUR", "LYMEIGGIGMAB", "LYMEABIGMQN", "HLAB27"   Coagulation Lab Results  Component Value Date   INR 1.1 09/22/2018   LABPROT 14.0 09/22/2018   APTT 31 09/22/2018   PLT 267 12/01/2019     Cardiovascular Lab Results  Component Value Date   HGB 13.4 12/01/2019   HCT 39.4 12/01/2019     Screening Lab Results  Component Value Date   SARSCOV2NAA NEGATIVE 12/01/2019   COVIDSOURCE NASOPHARYNGEAL 09/26/2018   STAPHAUREUS NEGATIVE 12/01/2019   MRSAPCR NEGATIVE 12/01/2019     Cancer No results found for: "CEA", "CA125", "LABCA2"   Allergens No results found for: "ALMOND", "APPLE", "ASPARAGUS", "AVOCADO", "BANANA", "BARLEY", "BASIL", "BAYLEAF", "GREENBEAN", "LIMABEAN", "WHITEBEAN", "BEEFIGE", "REDBEET", "BLUEBERRY", "BROCCOLI", "CABBAGE", "MELON", "CARROT", "CASEIN", "CASHEWNUT", "CAULIFLOWER", "CELERY"     Note: Lab results reviewed.  Recent Diagnostic Imaging Review  Lumbosacral Imaging: Lumbar MR wo contrast: Results for orders  placed during the hospital encounter of 09/20/21 MR LUMBAR SPINE WO CONTRAST  Narrative CLINICAL DATA:  Low back pain, symptoms persist with > 6 wks treatment Lumbar radiculopathy, symptoms persist with > 6 wks treatment  EXAM: MRI LUMBAR SPINE WITHOUT CONTRAST  TECHNIQUE: Multiplanar, multisequence MR imaging of the lumbar spine was performed. No intravenous contrast was administered.  COMPARISON:  Radiograph 02/15/2020, lumbar spine MRI 10/14/2019  FINDINGS: Segmentation:  Standard.  Alignment: Persistent grade 1 anterolisthesis at L4-L5. Improved retrolisthesis at L3-L4.  Vertebrae: Postsurgical changes of anterior and posterior fusion from L2 through L5. Reactive marrow edema at L4 and L5. Modic type 2 degenerative endplate changes at P2-Z3.  Conus medullaris and cauda equina: Conus extends to  the L1 level. Conus and cauda equina appear normal.  Paraspinal and other soft tissues: Bilateral renal cysts which require no follow-up imaging.  Disc levels:  Partially visualized mild disc bulging at T12-L1.  L1-L2: Mild disc bulging, ligament flavum hypertrophy mild facet arthropathy. There is mild spinal canal stenosis with left-sided lateral recess narrowing and mild bilateral neural foraminal stenosis, slightly progressed from prior MRI.  L2-L3: Prior anterior and posterior fusion. Patent spinal canal and neural foramina. Mild residual left lateral recess narrowing.  L3-L4: Prior anterior and posterior fusion. Patent spinal canal and neural foramina. No residual stenosis.  L4-L5: Prior anterior posterior fusion. Persistent grade 1 anterolisthesis with mild disc bulging, ligament flavum hypertrophy mild facet arthropathy. There is moderate right and mild left lateral recess stenosis. Mild bilateral neural foraminal stenosis.  L5-S1: Broad-based disc bulging with superimposed central disc protrusion, ligament flavum hypertrophy, and bilateral facet arthropathy. There is moderate to severe right lateral recess stenosis with encroachment of the descending right S1 nerve root (series 8, image 35). Mild left lateral recess stenosis without impingement. Mild left neural foraminal narrowing.  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  IMPRESSION: Mild spinal canal stenosis at L1-L2, with left lateral recess narrowing and mild bilateral neural foraminal stenosis, slightly progressed from prior MRI in October 2021. No neural impingement.  Postsurgical changes of anterior and posterior fusion from L2 through L5.  Patent spinal canal neural foramina at L2-L3 and L3-L4, with mild residual lateral recess narrowing on the left at L2-L3.  Persistent grade 1 anterolisthesis at L4-L5 along with disc bulging and facet degeneration results in moderate right and mild left lateral recess  stenosis and mild bilateral neural foraminal stenosis.  Broad-based disc bulging with superimposed central disc protrusion at L5-S1, along with ligamentum flavum hypertrophy and bilateral facet arthropathy results in moderate-severe right lateral recess stenosis and encroachment of the descending right S1 nerve root. Mild left lower recess and neural foraminal stenosis without impingement.   Electronically Signed By: Maurine Simmering M.D. On: 09/22/2021 09:39  Complexity Note: Imaging results reviewed.                         Meds   Current Outpatient Medications:    amLODipine (NORVASC) 5 MG tablet, Take 5 mg by mouth daily., Disp: , Rfl:    atorvastatin (LIPITOR) 20 MG tablet, Take 20 mg by mouth daily., Disp: , Rfl:    DULoxetine (CYMBALTA) 60 MG capsule, Take 120 mg by mouth daily., Disp: , Rfl:    EPINEPHrine 0.3 mg/0.3 mL IJ SOAJ injection, Inject into the muscle., Disp: , Rfl:    ferrous sulfate 325 (65 FE) MG EC tablet, Take 325 mg by mouth 3 (three) times daily with meals., Disp: ,  Rfl:    fluticasone (FLONASE) 50 MCG/ACT nasal spray, Place 2 sprays into both nostrils daily., Disp: , Rfl:    gabapentin (NEURONTIN) 300 MG capsule, Take 300 mg by mouth 3 (three) times daily as needed (pain)., Disp: , Rfl:    gabapentin (NEURONTIN) 300 MG capsule, Take by mouth., Disp: , Rfl:    ibuprofen (ADVIL) 800 MG tablet, Take 800 mg by mouth every 6 (six) hours as needed., Disp: , Rfl:    mirtazapine (REMERON) 30 MG tablet, Take 30 mg by mouth at bedtime., Disp: , Rfl:    mirtazapine (REMERON) 45 MG tablet, Take 45 mg by mouth at bedtime., Disp: , Rfl:    mirtazapine (REMERON) 7.5 MG tablet, Take 15 mg by mouth at bedtime., Disp: , Rfl:    Multiple Vitamin (MULTIVITAMIN WITH MINERALS) TABS tablet, Take 1 tablet by mouth daily., Disp: , Rfl:    naloxone (NARCAN) nasal spray 4 mg/0.1 mL, Place into the nose., Disp: , Rfl:    Omega-3 Fatty Acids (FISH OIL) 1000 MG CAPS, Take 1,000 mg by mouth  daily., Disp: , Rfl:    oxyCODONE-acetaminophen (PERCOCET/ROXICET) 5-325 MG tablet, SMARTSIG:1 Tablet(s) By Mouth Every 12 Hours PRN, Disp: , Rfl:    oxyCODONE-acetaminophen (PERCOCET/ROXICET) 5-325 MG tablet, Take by mouth., Disp: , Rfl:    Potassium Chloride ER 20 MEQ TBCR, Take 1 tablet by mouth daily., Disp: , Rfl:    pregabalin (LYRICA) 100 MG capsule, Take 100 mg by mouth 2 (two) times daily., Disp: , Rfl:    sertraline (ZOLOFT) 100 MG tablet, Take by mouth., Disp: , Rfl:    sertraline (ZOLOFT) 50 MG tablet, Take 50 mg by mouth daily., Disp: , Rfl:    simvastatin (ZOCOR) 10 MG tablet, Take 10 mg by mouth at bedtime., Disp: , Rfl:    tamsulosin (FLOMAX) 0.4 MG CAPS capsule, Take 0.4 mg by mouth daily., Disp: , Rfl:    traZODone (DESYREL) 100 MG tablet, Take 100 mg by mouth at bedtime., Disp: , Rfl:    traZODone (DESYREL) 150 MG tablet, Take by mouth., Disp: , Rfl:    traZODone (DESYREL) 50 MG tablet, Take by mouth., Disp: , Rfl:    vitamin E 180 MG (400 UNITS) capsule, Take 400 Units by mouth daily., Disp: , Rfl:   ROS  Constitutional: Denies any fever or chills Gastrointestinal: No reported hemesis, hematochezia, vomiting, or acute GI distress Musculoskeletal: Denies any acute onset joint swelling, redness, loss of ROM, or weakness Neurological: No reported episodes of acute onset apraxia, aphasia, dysarthria, agnosia, amnesia, paralysis, loss of coordination, or loss of consciousness  Allergies  Jonathan Stephens is allergic to bee venom.  [place]  Drug: Jonathan Stephens  reports no history of drug use. Alcohol:  reports that he does not currently use alcohol. Tobacco:  reports that he has been smoking e-cigarettes. He has never used smokeless tobacco. Medical:  has a past medical history of Hepatitis. Surgical: Jonathan Stephens  has a past surgical history that includes Mohs surgery (03/2018); Total hip arthroplasty (Left, 09/30/2018); Tonsillectomy; Anterior lat lumbar fusion (Left, 12/03/2019); and Lumbar  percutaneous pedicle screw 3 level (N/A, 12/03/2019). Family: family history is not on file.  Constitutional Exam  General appearance: Well nourished, well developed, and well hydrated. In no apparent acute distress Vitals:   12/06/21 0854  BP: (!) 151/85  Pulse: 75  Resp: 18  Temp: (!) 97.3 F (36.3 C)  SpO2: 97%  Weight: 215 lb (97.5 kg)  Height: _0  (1.88  m)   BMI Assessment: Estimated body mass index is 27.6 kg/m as calculated from the following:   Height as of this encounter: _0  (1.88 m).   Weight as of this encounter: 215 lb (97.5 kg).  BMI interpretation table: BMI level Category Range association with higher incidence of chronic pain  <18 kg/m2 Underweight   18.5-24.9 kg/m2 Ideal body weight   25-29.9 kg/m2 Overweight Increased incidence by 20%  30-34.9 kg/m2 Obese (Class I) Increased incidence by 68%  35-39.9 kg/m2 Severe obesity (Class II) Increased incidence by 136%  >40 kg/m2 Extreme obesity (Class III) Increased incidence by 254%   Patient's current BMI Ideal Body weight  Body mass index is 27.6 kg/m. Ideal body weight: 82.2 kg (181 lb 3.5 oz) Adjusted ideal body weight: 88.3 kg (194 lb 11.7 oz)   BMI Readings from Last 4 Encounters:  12/06/21 27.60 kg/m  09/11/21 27.60 kg/m  12/03/19 28.25 kg/m  12/01/19 28.71 kg/m   Wt Readings from Last 4 Encounters:  12/06/21 215 lb (97.5 kg)  09/11/21 215 lb (97.5 kg)  12/03/19 220 lb (99.8 kg)  12/01/19 223 lb 9.6 oz (101.4 kg)    Psych/Mental status: Alert, oriented x 3 (person, place, & time)       Eyes: PERLA Respiratory: No evidence of acute respiratory distress  Assessment & Plan  Primary Diagnosis & Pertinent Problem List: The primary encounter diagnosis was Chronic pain syndrome. 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  Visit Diagnosis: 1. Chronic pain syndrome   2. Chronic low back pain (1ry area of Pain) (Bilateral) (R>L) w/o sciatica   3. Chronic lower extremity pain (2ry area of Pain) (Bilateral) (L>R)   4. Failed back surgical syndrome   5. Abnormal drug screen (09/11/2021)   6. 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 Grade 1 Retrolisthesis of L3/L4   8. Greater trochanteric bursitis of hip (Right)   9. Lumbosacral facet joint syndrome (Bilateral)   10. Lumbar facet arthropathy (Multilevel) (Bilateral)   11. Lumbar facet joint pain   12. Osteoarthritis of facet joint of lumbar spine   13. Spondylosis without myelopathy or radiculopathy, lumbosacral region   14. Discogenic low back pain   15. Chronic sacroiliac joint pain (Right)   16. Chronic hip pain (Right)   17. History of total hip replacement (Left)   18. Burning pain (feet)   19. Abnormal MRI, lumbar spine (02/28/2017 & 09/22/2021)   20. 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 Improved retrolisthesis at L3-4    Grade 1 Retrolisthesis of L3/L4   grade 1 anterolisthesis at L4-5. Improved retrolisthesis at L3-4    Greater trochanteric bursitis of hip (Right)  Lumbosacral facet joint  syndrome (Bilateral)  Lumbar facet arthropathy (Multilevel) (Bilateral)  Lumbar Facet Joint Pain  Osteoarthritis of Facet Joint of Lumbar Spine  Spondylosis Without Myelopathy Or Radiculopathy, Lumbosacral Region  Discogenic Low Back Pain  Chronic hip pain (Right)  Chronic sacroiliac joint pain (Right)  History of total hip replacement (Left)  Burning pain (feet)  Abnormal MRI, lumbar spine (02/28/2017 & 09/22/2021)   (09/22/2021) LUMBAR MRI FINDINGS: Alignment: Persistent grade 1 anterolisthesis at L4-5. Improved retrolisthesis at L3-4. Vertebrae: Postsurgical changes of anterior and posterior fusion from L2 through L5. Reactive marrow edema at L4 and L5. Modic type 2 degenerative endplate changes at W8-0. Paraspinal and other soft tissues: Bilateral renal cysts which require no follow-up imaging.  DISC LEVELS: Partially visualized mild disc bulging at T12-L1. L1-2: Mild disc bulging, ligament flavum hypertrophy mild facet arthropathy. There is mild spinal canal stenosis with left-sided lateral recess narrowing and mild bilateral neural foraminal stenosis, slightly progressed from prior MRI. L2-3: Prior anterior and posterior fusion. Patent spinal canal and neural foramina. Mild residual left lateral recess narrowing. L3-4: Prior anterior and posterior fusion. L4-5: Prior anterior posterior fusion. Persistent grade 1 anterolisthesis with mild disc bulging, ligament flavum hypertrophy mild facet arthropathy. There is moderate right and mild left lateral recess stenosis. Mild bilateral neural foraminal stenosis. L5-S1: Broad-based disc bulging with superimposed central disc protrusion, ligament flavum hypertrophy, and bilateral facet arthropathy. There is moderate to severe right lateral recess stenosis with encroachment of the descending right S1 nerve root . Mild left lateral recess stenosis without impingement. Mild left neural foraminal narrowing. 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  IMPRESSION: Mild spinal canal stenosis at L1-2, with left lateral recess narrowing and mild bilateral neural foraminal stenosis, slightly progressed from prior MRI in October 2021. No neural impingement. Postsurgical changes of anterior and posterior fusion from L2 through L5. Patent spinal canal neural foramina at L2-3 and L3-4, with mild residual lateral recess narrowing on the left at L2-3. Persistent grade 1 anterolisthesis at L4-5 along with disc bulging and facet degeneration results in moderate right and mild left lateral recess stenosis and mild bilateral neural foraminal stenosis. Broad-based disc bulging with superimposed central disc protrusion at L5-S1, along with ligamentum flavum hypertrophy and bilateral facet arthropathy results in moderate-severe right lateral recess stenosis and encroachment of the descending right S1 nerve root. Mild left lower recess and neural foraminal stenosis without impingement.  ___________________________________________________________  (02/28/2017) LUMBAR MRI FINDINGS:  Conus: The conus is normal in appearance and position and terminates at L1-L2. Alignment: Mild grade 1 anterolisthesis of L4 on L5. Marrow: Reactive endplate changes, most prominent at L2-L3 where there is a presumed Schmorl's node in the anterior-superior aspect of L3 and a second in the posterior-superior aspect of L3; each of these has some high T2 signal intensity in the surrounding marrow.   DISC LEVELS: T12-L1: Broad-based disc bulge with ventral thecal sac flattening but no canal stenosis. No neuroforaminal stenosis. L1-2:  Left far lateral disc bulge does not appear to abut the exited nerve. No posterior disc protrusion, canal stenosis, or foraminal stenosis. L2-3: Disc space narrowing. Circumferential disc bulge and facet arthropathy contribute to mild central canal stenosis with flattening of  the ventral aspect of the thecal sac. Disc extends into the neural foramina   bilaterally causing mild right and moderate left neuroforaminal narrowing. The far lateral aspects of the disc bulge likely abut the exited L2 nerves bilaterally.  L3-4: Circumferential disc bulge, facet arthropathy and ligamentum flavum buckling contribute to moderate canal  stenosis. Disc bulge extends into the neural foramina bilaterally causing mild bilateral neuroforaminal narrowing, right greater than left. L4-5: Circumferential disc bulge, facet arthropathy, and ligamentum flavum buckling contribute to moderate canal stenosis and marked bilateral subarticular recess narrowing. Disc extends into the neural foramina bilaterally causing moderate bilateral neuroforaminal narrowing, right greater than left.  L5-S1: Broad-based disc bulge with ventral thecal sac flattening but no canal stenosis. No neuroforaminal stenosis. Mild facet arthropathy is present bilaterally.  Visualized SI joints: Right SI joint arthrosis.  Visualized soft tissues: Unremarkable.   IMPRESSION:  1.  Multilevel spondylosis with moderate lumbar spinal canal narrowing at L3-L4 and L4-L5. 2.  Multilevel mild and moderate neuroforaminal stenosis, greatest at L4-L5 bilaterally and at L2-L3 on the left. The far lateral components of the disc bulge at L2-L3 likely abut the exited L2 nerves bilaterally. 3.  There are presumed Schmorl's nodes in the anterior-superior and posterior-superior aspects of the L3 vertebral body with high T2 signal intensity in the adjacent marrow that could indicate acute or subacute components or could be reactive/discogenic.   Neuropathic pain of feet (Bilateral)  Primary Localized Osteoarthritis of Left Hip (Resolved)  Primary Osteoarthritis of Left Hip (Resolved)  Left Hip Pain (Resolved)    Plan of Care  Pharmacotherapy (Medications Ordered): No orders of the defined types were placed in this encounter.  Procedure Orders         LUMBAR FACET(MEDIAL BRANCH NERVE BLOCK) MBNB         HIP  INJECTION     Lab Orders  No laboratory test(s) ordered today   Imaging Orders         DG Lumbar Spine Complete W/Bend     Referral Orders  No referral(s) requested today    Pharmacological management:  Opioid Analgesics: I will not be prescribing any opioids at this time Membrane stabilizer: I will not be prescribing any at this time Muscle relaxant: I will not be prescribing any at this time NSAID: I will not be prescribing any at this time Other analgesic(s): I will not be prescribing any at this time     Interventional Therapies  Risk  Complexity Considerations:   Estimated body mass index is 27.6 kg/m as calculated from the following:   Height as of this encounter: _0  (1.88 m).   Weight as of this encounter: 215 lb (97.5 kg). WNL   Planned  Pending:   Diagnostic x-rays of the lumbar spine with flexion and extension views to evaluate stability of spondylolisthesis. Diagnostic/therapeutic right greater trochanteric bursa injection #1  Diagnostic bilateral lumbar facet MBB #1    Under consideration:   Diagnostic/therapeutic right greater trochanteric bursa injection #1  Diagnostic bilateral lumbar facet MBB #1    Completed:   None at this time   Completed by other providers:   Diagnostic/therapeutic left L2-3 TFESI x1 (03/04/2017) by Etheleen Mayhew, MD ([place])  [place] left IA hip injection x2 (04/12/2017, 05/08/2017) by Etheleen Mayhew, MD ([place])  [place] left L4-5 and L5-S1 TFESI x3 (05/29/2017, 09/20/2017, 12/12/2018) by Etheleen Mayhew, MD ([place])  Diagnostic/therapeutic right L4-5 and L5-S1 TESI x1 (06/26/2019)  by Etheleen Mayhew, MD ([place])  Therapeutic left total hip replacement (THR) x1 (09/30/2018) by P.  Dalldorf, MD  Therapeutic left L2-3, L3-4, L4-5 anterior lateral lumbar interbody fusion with percutaneous pedicle screw fixation (left) (12/03/2019) by Dr. Erline Levine     Therapeutic  Palliative (PRN) options:   None established  Provider-requested follow-up: Return for (ECT): (B) L-FCT Blk #1 + (R) TBI #1. Recent Visits Date Type Provider Dept  09/11/21 Office Visit Milinda Pointer, MD [facility]  Showing recent visits within past 90 days and meeting all other requirements Today's Visits Date Type Provider Dept  12/06/21 Office Visit Milinda Pointer, MD [facility]  Showing today's visits and meeting all other requirements Future Appointments Date Type Provider Dept  12/12/21 Appointment Milinda Pointer, MD [facility]  Showing future appointments within next 90 days and meeting all other requirements  Time spent: 96 minutes  Primary Care Physician: Barbaraann Boys, MD Note by: Gaspar Cola, MD Date: 12/06/2021; Time: 4:15 PM

## 2021-12-06 ENCOUNTER — Ambulatory Visit
Admission: RE | Admit: 2021-12-06 | Discharge: 2021-12-06 | Disposition: A | Discharge status: Home / Self Care | Payer: Federal, State, Local not specified - PPO | Source: Ambulatory Visit | Attending: Pain Medicine | Admitting: Pain Medicine

## 2021-12-06 ENCOUNTER — Ambulatory Visit: Payer: Federal, State, Local not specified - PPO | Admitting: Pain Medicine

## 2021-12-06 VITALS — BP 151/85 | HR 75 | Temp 97.3°F | Resp 18 | Ht 74.0 in | Wt 215.0 lb

## 2021-12-06 DIAGNOSIS — M533 Sacrococcygeal disorders, not elsewhere classified: Secondary | ICD-10-CM | POA: Insufficient documentation

## 2021-12-06 DIAGNOSIS — M545 Low back pain, unspecified: Secondary | ICD-10-CM | POA: Diagnosis present

## 2021-12-06 DIAGNOSIS — R937 Abnormal findings on diagnostic imaging of other parts of musculoskeletal system: Secondary | ICD-10-CM | POA: Insufficient documentation

## 2021-12-06 DIAGNOSIS — M79605 Pain in left leg: Secondary | ICD-10-CM | POA: Insufficient documentation

## 2021-12-06 DIAGNOSIS — M47817 Spondylosis without myelopathy or radiculopathy, lumbosacral region: Secondary | ICD-10-CM | POA: Insufficient documentation

## 2021-12-06 DIAGNOSIS — M79604 Pain in right leg: Secondary | ICD-10-CM | POA: Insufficient documentation

## 2021-12-06 DIAGNOSIS — R892 Abnormal level of other drugs, medicaments and biological substances in specimens from other organs, systems and tissues: Secondary | ICD-10-CM | POA: Diagnosis present

## 2021-12-06 DIAGNOSIS — M5459 Other low back pain: Secondary | ICD-10-CM

## 2021-12-06 DIAGNOSIS — M47816 Spondylosis without myelopathy or radiculopathy, lumbar region: Secondary | ICD-10-CM | POA: Insufficient documentation

## 2021-12-06 DIAGNOSIS — M4316 Spondylolisthesis, lumbar region: Secondary | ICD-10-CM | POA: Insufficient documentation

## 2021-12-06 DIAGNOSIS — G8929 Other chronic pain: Secondary | ICD-10-CM

## 2021-12-06 DIAGNOSIS — M4310 Spondylolisthesis, site unspecified: Secondary | ICD-10-CM | POA: Diagnosis present

## 2021-12-06 DIAGNOSIS — R52 Pain, unspecified: Secondary | ICD-10-CM | POA: Diagnosis present

## 2021-12-06 DIAGNOSIS — Z96642 Presence of left artificial hip joint: Secondary | ICD-10-CM | POA: Insufficient documentation

## 2021-12-06 DIAGNOSIS — E876 Hypokalemia: Secondary | ICD-10-CM

## 2021-12-06 DIAGNOSIS — M5136 Other intervertebral disc degeneration, lumbar region: Secondary | ICD-10-CM

## 2021-12-06 DIAGNOSIS — G894 Chronic pain syndrome: Secondary | ICD-10-CM

## 2021-12-06 DIAGNOSIS — M7061 Trochanteric bursitis, right hip: Secondary | ICD-10-CM

## 2021-12-06 DIAGNOSIS — M961 Postlaminectomy syndrome, not elsewhere classified: Secondary | ICD-10-CM | POA: Diagnosis not present

## 2021-12-06 DIAGNOSIS — M25551 Pain in right hip: Secondary | ICD-10-CM | POA: Diagnosis present

## 2021-12-06 NOTE — Patient Instructions (Signed)
______________________________________________________________________  Preparing for your procedure  During your procedure appointment there will be: No Prescription Refills. No disability issues to discussed. No medication changes or discussions.  Instructions: Food intake: Avoid eating anything solid for at least 8 hours prior to your procedure. Clear liquid intake: You may take clear liquids such as water up to 2 hours prior to your procedure. (No carbonated drinks. No soda.) Transportation: Unless otherwise stated by your physician, bring a driver. Morning Medicines: Except for blood thinners, take all of your other morning medications with a sip of water. Make sure to take your heart and blood pressure medicines. If your blood pressure's lower number is above 100, the case will be rescheduled. Blood thinners: If you take a blood thinner, but were not instructed to stop it, call our office [phone] and ask to talk to a nurse. Not stopping a blood thinner prior to certain procedures could lead to serious complications. Diabetics on insulin: Notify the staff so that you can be scheduled 1st case in the morning. If your diabetes requires high dose insulin, take only  of your normal insulin dose the morning of the procedure and notify the staff that you have done so. Preventing infections: Shower with an antibacterial soap the morning of your procedure.  Build-up your immune system: Take 1000 mg of Vitamin C with every meal (3 times a day) the day prior to your procedure. Antibiotics: Inform the nursing staff if you are taking any antibiotics or if you have any conditions that may require antibiotics prior to procedures. (Example: recent joint implants)   Pregnancy: If you are pregnant make sure to notify the nursing staff. Not doing so may result in injury to the fetus, including death.  Sickness: If you have a cold, fever, or any active infections, call and cancel or reschedule your  procedure. Receiving steroids while having an infection may result in complications. Arrival: You must be in the facility at least 30 minutes prior to your scheduled procedure. Tardiness: Your scheduled time is also the cutoff time. If you do not arrive at least 15 minutes prior to your procedure, you will be rescheduled.  Children: Do not bring any children with you. Make arrangements to keep them home. Dress appropriately: There is always a possibility that your clothing may get soiled. Avoid long dresses. Valuables: Do not bring any jewelry or valuables.  Reasons to call and reschedule or cancel your procedure: (Following these recommendations will minimize the risk of a serious complication.) Surgeries: Avoid having procedures within 2 weeks of any surgery. (Avoid for 2 weeks before or after any surgery). Flu Shots: Avoid having procedures within 2 weeks of a flu shots or . (Avoid for 2 weeks before or after immunizations). Barium: Avoid having a procedure within 7-10 days after having had a radiological study involving the use of radiological contrast. (Myelograms, Barium swallow or enema study). Heart attacks: Avoid any elective procedures or surgeries for the initial 6 months after a "Myocardial Infarction" (Heart Attack). Blood thinners: It is imperative that you stop these medications before procedures. Let us know if you if you take any blood thinner.  Infection: Avoid procedures during or within two weeks of an infection (including chest colds or gastrointestinal problems). Symptoms associated with infections include: Localized redness, fever, chills, night sweats or profuse sweating, burning sensation when voiding, cough, congestion, stuffiness, runny nose, sore throat, diarrhea, nausea, vomiting, cold or Flu symptoms, recent or current infections. It is specially important if the infection is   over the area that we intend to treat. Heart and lung problems: Symptoms that may suggest an  active cardiopulmonary problem include: cough, chest pain, breathing difficulties or shortness of breath, dizziness, ankle swelling, uncontrolled high or unusually low blood pressure, and/or palpitations. If you are experiencing any of these symptoms, cancel your procedure and contact your primary care physician for an evaluation.  Remember:  Regular Business hours are:  Monday to Thursday 8:00 AM to 4:00 PM  Provider's Schedule: Blanche Gallien, MD:  Procedure days: Tuesday and Thursday 7:30 AM to 4:00 PM  Bilal Lateef, MD:  Procedure days: Monday and Wednesday 7:30 AM to 4:00 PM  ______________________________________________________________________    ____________________________________________________________________________________________  General Risks and Possible Complications  Patient Responsibilities: It is important that you read this as it is part of your informed consent. It is our duty to inform you of the risks and possible complications associated with treatments offered to you. It is your responsibility as a patient to read this and to ask questions about anything that is not clear or that you believe was not covered in this document.  Patient's Rights: You have the right to refuse treatment. You also have the right to change your mind, even after initially having agreed to have the treatment done. However, under this last option, if you wait until the last second to change your mind, you may be charged for the materials used up to that point.  Introduction: Medicine is not an exact science. Everything in Medicine, including the lack of treatment(s), carries the potential for danger, harm, or loss (which is by definition: Risk). In Medicine, a complication is a secondary problem, condition, or disease that can aggravate an already existing one. All treatments carry the risk of possible complications. The fact that a side effects or complications occurs, does not imply  that the treatment was conducted incorrectly. It must be clearly understood that these can happen even when everything is done following the highest safety standards.  No treatment: You can choose not to proceed with the proposed treatment alternative. The "PRO(s)" would include: avoiding the risk of complications associated with the therapy. The "CON(s)" would include: not getting any of the treatment benefits. These benefits fall under one of three categories: diagnostic; therapeutic; and/or palliative. Diagnostic benefits include: getting information which can ultimately lead to improvement of the disease or symptom(s). Therapeutic benefits are those associated with the successful treatment of the disease. Finally, palliative benefits are those related to the decrease of the primary symptoms, without necessarily curing the condition (example: decreasing the pain from a flare-up of a chronic condition, such as incurable terminal cancer).  General Risks and Complications: These are associated to most interventional treatments. They can occur alone, or in combination. They fall under one of the following six (6) categories: no benefit or worsening of symptoms; bleeding; infection; nerve damage; allergic reactions; and/or death. No benefits or worsening of symptoms: In Medicine there are no guarantees, only probabilities. No healthcare provider can ever guarantee that a medical treatment will work, they can only state the probability that it may. Furthermore, there is always the possibility that the condition may worsen, either directly, or indirectly, as a consequence of the treatment. Bleeding: This is more common if the patient is taking a blood thinner, either prescription or over the counter (example: Goody Powders, Fish oil, Aspirin, Garlic, etc.), or if suffering a condition associated with impaired coagulation (example: Hemophilia, cirrhosis of the liver, low platelet counts, etc.). However, even if   you  do not have one on these, it can still happen. If you have any of these conditions, or take one of these drugs, make sure to notify your treating physician. Infection: This is more common in patients with a compromised immune system, either due to disease (example: diabetes, cancer, human immunodeficiency virus [HIV], etc.), or due to medications or treatments (example: therapies used to treat cancer and rheumatological diseases). However, even if you do not have one on these, it can still happen. If you have any of these conditions, or take one of these drugs, make sure to notify your treating physician. Nerve Damage: This is more common when the treatment is an invasive one, but it can also happen with the use of medications, such as those used in the treatment of cancer. The damage can occur to small secondary nerves, or to large primary ones, such as those in the spinal cord and brain. This damage may be temporary or permanent and it may lead to impairments that can range from temporary numbness to permanent paralysis and/or brain death. Allergic Reactions: Any time a substance or material comes in contact with our body, there is the possibility of an allergic reaction. These can range from a mild skin rash (contact dermatitis) to a severe systemic reaction (anaphylactic reaction), which can result in death. Death: In general, any medical intervention can result in death, most of the time due to an unforeseen complication. ____________________________________________________________________________________________    

## 2021-12-06 NOTE — Progress Notes (Signed)
Safety precautions to be maintained throughout the outpatient stay will include: orient to surroundings, keep bed in low position, maintain call bell within reach at all times, provide assistance with transfer out of bed and ambulation.  

## 2021-12-12 ENCOUNTER — Ambulatory Visit: Payer: Federal, State, Local not specified - PPO | Attending: Pain Medicine | Admitting: Pain Medicine

## 2021-12-12 ENCOUNTER — Encounter: Payer: Self-pay | Admitting: Pain Medicine

## 2021-12-12 ENCOUNTER — Ambulatory Visit
Admission: RE | Admit: 2021-12-12 | Discharge: 2021-12-12 | Disposition: A | Discharge status: Home / Self Care | Payer: Federal, State, Local not specified - PPO | Source: Ambulatory Visit | Attending: Pain Medicine | Admitting: Pain Medicine

## 2021-12-12 VITALS — BP 125/81 | HR 83 | Temp 97.2°F | Resp 18 | Ht 74.0 in | Wt 215.0 lb

## 2021-12-12 DIAGNOSIS — M7061 Trochanteric bursitis, right hip: Secondary | ICD-10-CM

## 2021-12-12 DIAGNOSIS — M5136 Other intervertebral disc degeneration, lumbar region: Secondary | ICD-10-CM | POA: Insufficient documentation

## 2021-12-12 DIAGNOSIS — M545 Low back pain, unspecified: Secondary | ICD-10-CM

## 2021-12-12 DIAGNOSIS — M25551 Pain in right hip: Secondary | ICD-10-CM | POA: Diagnosis present

## 2021-12-12 DIAGNOSIS — M961 Postlaminectomy syndrome, not elsewhere classified: Secondary | ICD-10-CM | POA: Diagnosis present

## 2021-12-12 DIAGNOSIS — M4316 Spondylolisthesis, lumbar region: Secondary | ICD-10-CM | POA: Diagnosis present

## 2021-12-12 DIAGNOSIS — M5459 Other low back pain: Secondary | ICD-10-CM

## 2021-12-12 DIAGNOSIS — M47817 Spondylosis without myelopathy or radiculopathy, lumbosacral region: Secondary | ICD-10-CM

## 2021-12-12 DIAGNOSIS — M51369 Other intervertebral disc degeneration, lumbar region without mention of lumbar back pain or lower extremity pain: Secondary | ICD-10-CM

## 2021-12-12 DIAGNOSIS — G8929 Other chronic pain: Secondary | ICD-10-CM

## 2021-12-12 DIAGNOSIS — M4310 Spondylolisthesis, site unspecified: Secondary | ICD-10-CM

## 2021-12-12 DIAGNOSIS — M47816 Spondylosis without myelopathy or radiculopathy, lumbar region: Secondary | ICD-10-CM | POA: Diagnosis present

## 2021-12-12 MED ORDER — PENTAFLUOROPROP-TETRAFLUOROETH EX AERO
INHALATION_SPRAY | Freq: Once | CUTANEOUS | Status: AC
  Administered 2021-12-12: 2 via TOPICAL
  Filled 2021-12-12: qty 116

## 2021-12-12 MED ORDER — FENTANYL CITRATE (PF) 100 MCG/2ML IJ SOLN: 25.0000 ug | INTRAMUSCULAR | Status: DC | PRN

## 2021-12-12 MED ORDER — MIDAZOLAM HCL 5 MG/5ML IJ SOLN: 0.5000 mg | Freq: Once | INTRAMUSCULAR | Status: DC

## 2021-12-12 MED ORDER — TRIAMCINOLONE ACETONIDE 40 MG/ML IJ SUSP
80.0000 mg | Freq: Once | INTRAMUSCULAR | Status: AC
  Administered 2021-12-12: 80 mg
  Filled 2021-12-12: qty 2

## 2021-12-12 MED ORDER — ROPIVACAINE HCL 2 MG/ML IJ SOLN
4.0000 mL | Freq: Once | INTRAMUSCULAR | Status: AC
  Administered 2021-12-12: 4 mL via INTRA_ARTICULAR
  Filled 2021-12-12: qty 20

## 2021-12-12 MED ORDER — LIDOCAINE HCL 2 % IJ SOLN
20.0000 mL | Freq: Once | INTRAMUSCULAR | Status: AC
  Administered 2021-12-12: 400 mg
  Filled 2021-12-12: qty 20

## 2021-12-12 MED ORDER — MIDAZOLAM HCL 2 MG/2ML IJ SOLN: 0.5000 mg | Freq: Once | INTRAMUSCULAR | Status: DC

## 2021-12-12 MED ORDER — METHYLPREDNISOLONE ACETATE 80 MG/ML IJ SUSP
80.0000 mg | Freq: Once | INTRAMUSCULAR | Status: AC
  Administered 2021-12-12: 80 mg via INTRA_ARTICULAR
  Filled 2021-12-12: qty 1

## 2021-12-12 MED ORDER — ROPIVACAINE HCL 2 MG/ML IJ SOLN
18.0000 mL | Freq: Once | INTRAMUSCULAR | Status: AC
  Administered 2021-12-12: 18 mL via PERINEURAL
  Filled 2021-12-12: qty 20

## 2021-12-12 MED ORDER — LACTATED RINGERS IV SOLN: Freq: Once | INTRAVENOUS | Status: DC

## 2021-12-12 NOTE — Progress Notes (Signed)
PROVIDER NOTE: Interpretation of information contained herein should be left to medically-trained personnel. Specific patient instructions are provided elsewhere under "Patient Instructions" section of medical record. This document was created in part using STT-dictation technology, any transcriptional errors that may result from this process are unintentional.  Patient: Jonathan Stephens Type: Established DOB: 1960-03-31 MRN: [id number] PCP: Orene Desanctis, MD  Service: Procedure DOS: 12/12/2021 Setting: Ambulatory Location: Ambulatory outpatient facility Delivery: Face-to-face Provider: Oswaldo Done, MD Specialty: Interventional Pain Management Specialty designation: 09 Location: Outpatient facility Ref. Prov.: Orene Desanctis, MD    Procedure #1:   Type: Lumbar Facet, Medial Branch Block(s) #1  Laterality: Bilateral  Level: L2, L3, L4, L5, and S1 Medial Branch Level(s). Injecting these levels blocks the L3-4, L4-5, and L5-S1 lumbar facet joints. Imaging: Fluoroscopic guidance         Anesthesia: Local anesthesia (1-2% Lidocaine) Anxiolysis: No IV. Sedation: No Sedation                       DOS: 12/12/2021 Performed by: Oswaldo Done, MD  Primary Purpose: Diagnostic/Therapeutic Indications: Low back pain severe enough to impact quality of life or function. 1. Lumbosacral facet joint syndrome (Bilateral)   2. Spondylosis without myelopathy or radiculopathy, lumbosacral region   3. Lumbar facet joint pain   4. Lumbar facet arthropathy (Multilevel) (Bilateral)   5. Grade 1 Retrolisthesis of L3/L4   6. 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 DDD (degenerative disc disease), lumbar   8. Chronic low back pain (1ry area of Pain) (Bilateral) (R>L) w/o sciatica   9. Failed back surgical syndrome    Procedure #2:            Type: Hip bursa injection #1 (trochanteric bursa) Primary Purpose: Diagnostic/Therapeutric Region: Upper (proximal) Femoral Region Level:  Hip Joint Target Area: Trochanteric Bursa Approach: Posterolateral approach Laterality: Right  Position: Prone   1. Greater trochanteric bursitis of hip (Right)   2. Chronic hip pain (Right)    NAS-11 Pain score:   Pre-procedure: 8 /10   Post-procedure: 0-No pain/10     Position / Prep / Materials:  Position: Prone  Prep solution: DuraPrep (Iodine Povacrylex [0.7% available iodine] and Isopropyl Alcohol, 74% w/w) Area Prepped: Posterolateral Lumbosacral Spine (Wide prep: From the lower border of the scapula down to the end of the tailbone and from flank to flank.)  Materials:  Tray: Block Needle(s):  Type: Spinal  Gauge (G): 22  Length: 5-in Qty: 4     Pre-op H&P Assessment:  Jonathan Stephens is a 61 y.o. (year old), male patient, seen today for interventional treatment. 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 Jonathan Stephens has a current medication list which includes the following prescription(s): amlodipine, atorvastatin, duloxetine, epinephrine, ferrous sulfate, fluticasone, gabapentin, gabapentin, ibuprofen, mirtazapine, mirtazapine, mirtazapine, multivitamin with minerals, naloxone, fish oil, oxycodone-acetaminophen, oxycodone-acetaminophen, potassium chloride er, pregabalin, sertraline, sertraline, simvastatin, tamsulosin, trazodone, trazodone, trazodone, and vitamin e, and the following Facility-Administered Medications: fentanyl. His primarily concern today is the Back Pain  Initial Vital Signs:  Pulse/HCG Rate: 83ECG Heart Rate: 77 Temp: (!) 97.2 F (36.2 C) Resp: 18 BP: 133/74 SpO2: 100 %  BMI: Estimated body mass index is 27.6 kg/m as calculated from the following:   Height as of this encounter:  (1.88 m).   Weight  as of this encounter: 215 lb (97.5 kg).  Risk  Assessment: Allergies: Reviewed. He is allergic to bee venom.  Allergy Precautions: None required Coagulopathies: Reviewed. None identified.  Blood-thinner therapy: None at this time Active Infection(s): Reviewed. None identified. Jonathan Stephens is afebrile  Site Confirmation: Jonathan Stephens was asked to confirm the procedure and laterality before marking the site Procedure checklist: Completed Consent: Before the procedure and under the influence of no sedative(s), amnesic(s), or anxiolytics, the patient was informed of the treatment options, risks and possible complications. To fulfill our ethical and legal obligations, as recommended by the American Medical Association's Code of Ethics, I have informed the patient of my clinical impression; the nature and purpose of the treatment or procedure; the risks, benefits, and possible complications of the intervention; the alternatives, including doing nothing; the risk(s) and benefit(s) of the alternative treatment(s) or procedure(s); and the risk(s) and benefit(s) of doing nothing. The patient was provided information about the general risks and possible complications associated with the procedure. These may include, but are not limited to: failure to achieve desired goals, infection, bleeding, organ or nerve damage, allergic reactions, paralysis, and death. In addition, the patient was informed of those risks and complications associated to Spine-related procedures, such as failure to decrease pain; infection (i.e.: Meningitis, epidural or intraspinal abscess); bleeding (i.e.: epidural hematoma, subarachnoid hemorrhage, or any other type of intraspinal or peri-dural bleeding); organ or nerve damage (i.e.: Any type of peripheral nerve, nerve root, or spinal cord injury) with subsequent damage to sensory, motor, and/or autonomic systems, resulting in permanent pain, numbness, and/or weakness of one or several areas of the body; allergic reactions; (i.e.: anaphylactic  reaction); and/or death. Furthermore, the patient was informed of those risks and complications associated with the medications. These include, but are not limited to: allergic reactions (i.e.: anaphylactic or anaphylactoid reaction(s)); adrenal axis suppression; blood sugar elevation that in diabetics may result in ketoacidosis or comma; water retention that in patients with history of congestive heart failure may result in shortness of breath, pulmonary edema, and decompensation with resultant heart failure; weight gain; swelling or edema; medication-induced neural toxicity; particulate matter embolism and blood vessel occlusion with resultant organ, and/or nervous system infarction; and/or aseptic necrosis of one or more joints. Finally, the patient was informed that Medicine is not an exact science; therefore, there is also the possibility of unforeseen or unpredictable risks and/or possible complications that may result in a catastrophic outcome. The patient indicated having understood very clearly. We have given the patient no guarantees and we have made no promises. Enough time was given to the patient to ask questions, all of which were answered to the patient's satisfaction. Mr. Dubie has indicated that he wanted to continue with the procedure. Attestation: I, the ordering provider, attest that I have discussed with the patient the benefits, risks, side-effects, alternatives, likelihood of achieving goals, and potential problems during recovery for the procedure that I have provided informed consent. Date  Time: 12/12/2021  9:06 AM  Pre-Procedure Preparation:  Monitoring: As per clinic protocol. Respiration, ETCO2, SpO2, BP, heart rate and rhythm monitor placed and checked for adequate function Safety Precautions: Patient was assessed for positional comfort and pressure points before starting the procedure. Time-out: I initiated and conducted the "Time-out" before starting the procedure, as per  protocol. The patient was asked to participate by confirming the accuracy of the "Time Out" information. Verification of the correct person, site, and procedure were performed and confirmed by me, the nursing staff,  and the patient. "Time-out" conducted as per Joint Commission's Universal Protocol (UP.01.01.01). Time: 1610  Description of Procedure #1:  Laterality: Bilateral. The procedure was performed in identical fashion on both sides. Targeted Levels: L2, L3, L4, L5, and S1  Medial Branch Level(s)  Safety Precautions: Aspiration looking for blood return was conducted prior to all injections. At no point did we inject any substances, as a needle was being advanced. Before injecting, the patient was told to immediately notify me if he was experiencing any new onset of "ringing in the ears, or metallic taste in the mouth". No attempts were made at seeking any paresthesias. Safe injection practices and needle disposal techniques used. Medications properly checked for expiration dates. SDV (single dose vial) medications used. After the completion of the procedure, all disposable equipment used was discarded in the proper designated medical waste containers. Local Anesthesia: Protocol guidelines were followed. The patient was positioned over the fluoroscopy table. The area was prepped in the usual manner. The time-out was completed. The target area was identified using fluoroscopy. A 12-in long, straight, sterile hemostat was used with fluoroscopic guidance to locate the targets for each level blocked. Once located, the skin was marked with an approved surgical skin marker. Once all sites were marked, the skin (epidermis, dermis, and hypodermis), as well as deeper tissues (fat, connective tissue and muscle) were infiltrated with a small amount of a short-acting local anesthetic, loaded on a 10cc syringe with a 25G, 1.5-in  Needle. An appropriate amount of time was allowed for local anesthetics to take effect  before proceeding to the next step. Local Anesthetic: Lidocaine 2.0% The unused portion of the local anesthetic was discarded in the proper designated containers. Technical description of process:  L2 Medial Branch Nerve Block (MBB): The target area for the L2 medial branch is at the junction of the postero-lateral aspect of the superior articular process and the superior, posterior, and medial edge of the transverse process of L3. Under fluoroscopic guidance, a Quincke needle was inserted until contact was made with os over the superior postero-lateral aspect of the pedicular shadow (target area). After negative aspiration for blood, 0.5 mL of the nerve block solution was injected without difficulty or complication. The needle was removed intact. L3 Medial Branch Nerve Block (MBB): The target area for the L3 medial branch is at the junction of the postero-lateral aspect of the superior articular process and the superior, posterior, and medial edge of the transverse process of L4. Under fluoroscopic guidance, a Quincke needle was inserted until contact was made with os over the superior postero-lateral aspect of the pedicular shadow (target area). After negative aspiration for blood, 0.5 mL of the nerve block solution was injected without difficulty or complication. The needle was removed intact. L4 Medial Branch Nerve Block (MBB): The target area for the L4 medial branch is at the junction of the postero-lateral aspect of the superior articular process and the superior, posterior, and medial edge of the transverse process of L5. Under fluoroscopic guidance, a Quincke needle was inserted until contact was made with os over the superior postero-lateral aspect of the pedicular shadow (target area). After negative aspiration for blood, 0.5 mL of the nerve block solution was injected without difficulty or complication. The needle was removed intact. L5 Medial Branch Nerve Block (MBB): The target area for the L5  medial branch is at the junction of the postero-lateral aspect of the superior articular process and the superior, posterior, and medial edge of the sacral ala.  Under fluoroscopic guidance, a Quincke needle was inserted until contact was made with os over the superior postero-lateral aspect of the pedicular shadow (target area). After negative aspiration for blood, 0.5 mL of the nerve block solution was injected without difficulty or complication. The needle was removed intact. S1 Medial Branch Nerve Block (MBB): The target area for the S1 medial branch is at the posterior and inferior 6 o'clock position of the L5-S1 facet joint. Under fluoroscopic guidance, the Quincke needle inserted for the L5 MBB was redirected until contact was made with os over the inferior and postero aspect of the sacrum, at the 6 o' clock position under the L5-S1 facet joint (Target area). After negative aspiration for blood, 0.5 mL of the nerve block solution was injected without difficulty or complication. The needle was removed intact.  Once the entire procedure was completed, the treated area was cleaned, making sure to leave some of the prepping solution back to take advantage of its long term bactericidal properties.         Illustration of the posterior view of the lumbar spine and the posterior neural structures. Laminae of L2 through S1 are labeled. DPRL5, dorsal primary ramus of L5; DPRS1, dorsal primary ramus of S1; DPR3, dorsal primary ramus of L3; FJ, facet (zygapophyseal) joint L3-L4; I, inferior articular process of L4; LB1, lateral branch of dorsal primary ramus of L1; IAB, inferior articular branches from L3 medial branch (supplies L4-L5 facet joint); IBP, intermediate branch plexus; MB3, medial branch of dorsal primary ramus of L3; NR3, third lumbar nerve root; S, superior articular process of L5; SAB, superior articular branches from L4 (supplies L4-5 facet joint also); TP3, transverse process of L3.  Vitals:    12/12/21 0958 12/12/21 1003 12/12/21 1008 12/12/21 1012  BP: (!) 145/88 (!) 126/101 (!) 111/95 125/81  Pulse:      Resp: 15 (!) 21 16 18   Temp:      TempSrc:      SpO2: 96% 97% 97% 98%  Weight:      Height:         Start Time: 0953 hrs.  Description of Procedure #2:  Area Prepped: Entire Posterolateral hip area. DuraPrep (Iodine Povacrylex [0.7% available iodine] and Isopropyl Alcohol, 74% w/w) Safety Precautions: Aspiration looking for blood return was conducted prior to all injections. At no point did we inject any substances, as a needle was being advanced. No attempts were made at seeking any paresthesias. Safe injection practices and needle disposal techniques used. Medications properly checked for expiration dates. SDV (single dose vial) medications used. Description of the Procedure: Protocol guidelines were followed. The patient was placed in position over the procedure table. The target area was identified and the area prepped in the usual manner. Skin & deeper tissues infiltrated with local anesthetic. Appropriate amount of time allowed to pass for local anesthetics to take effect. The procedure needles were then advanced to the target area. Proper needle placement secured. Negative aspiration confirmed. Solution injected in intermittent fashion, asking for systemic symptoms every 0.5cc of injectate. The needles were then removed and the area cleansed, making sure to leave some of the prepping solution back to take advantage of its long term bactericidal properties. There were no vitals filed for this visit.      Materials:  Needle(s) Type: Spinal Needle Gauge: 22G Length: 5.0-in Medication(s): Please see orders for medications and dosing details.  End Time: 1010 hrs.  Imaging Guidance (Spinal) for procedure #1:  Type of Imaging Technique:  Fluoroscopy Guidance (Spinal) Indication(s): Assistance in needle guidance and placement for procedures requiring needle placement in  or near specific anatomical locations not easily accessible without such assistance. Exposure Time: Please see nurses notes. Contrast: None used. Fluoroscopic Guidance: I was personally present during the use of fluoroscopy. "Tunnel Vision Technique" used to obtain the best possible view of the target area. Parallax error corrected before commencing the procedure. "Direction-depth-direction" technique used to introduce the needle under continuous pulsed fluoroscopy. Once target was reached, antero-posterior, oblique, and lateral fluoroscopic projection used confirm needle placement in all planes. Images permanently stored in EMR. Interpretation: No contrast injected. I personally interpreted the imaging intraoperatively. Adequate needle placement confirmed in multiple planes. Permanent images saved into the patient's record.  Imaging Guidance (Non-Spinal) for procedure #2:  Type of Imaging Technique: Fluoroscopy Guidance (Non-Spinal) Indication(s): Assistance in needle guidance and placement for procedures requiring needle placement in or near specific anatomical locations not easily accessible without such assistance. Exposure Time: Please see nurses notes. Contrast: Before injecting any contrast, we confirmed that the patient did not have an allergy to iodine, shellfish, or radiological contrast. Once satisfactory needle placement was completed at the desired level, radiological contrast was injected. Contrast injected under live fluoroscopy. No contrast complications. See chart for type and volume of contrast used. Fluoroscopic Guidance: I was personally present during the use of fluoroscopy. "Tunnel Vision Technique" used to obtain the best possible view of the target area. Parallax error corrected before commencing the procedure. "Direction-depth-direction" technique used to introduce the needle under continuous pulsed fluoroscopy. Once target was reached, antero-posterior, oblique, and lateral  fluoroscopic projection used confirm needle placement in all planes. Images permanently stored in EMR. Interpretation: I personally interpreted the imaging intraoperatively. Adequate needle placement confirmed in multiple planes. Appropriate spread of contrast into desired area was observed. No evidence of afferent or efferent intravascular uptake. Permanent images saved into the patient's record.  Antibiotic Prophylaxis:   Anti-infectives (From admission, onward)    None      Indication(s): None identified  Post-operative Assessment:  Post-procedure Vital Signs:  Pulse/HCG Rate: 8379 Temp: (!) 97.2 F (36.2 C) Resp: 18 BP: 125/81 SpO2: 98 %  EBL: None  Complications: No immediate post-treatment complications observed by team, or reported by patient.  Note: The patient tolerated the entire procedure well. A repeat set of vitals were taken after the procedure and the patient was kept under observation following institutional policy, for this type of procedure. Post-procedural neurological assessment was performed, showing return to baseline, prior to discharge. The patient was provided with post-procedure discharge instructions, including a section on how to identify potential problems. Should any problems arise concerning this procedure, the patient was given instructions to immediately contact us, at any time, without hesitation. In any case, we plan to contact the patient by telephone for a follow-up status report regarding this interventional procedure.  Comments:  No additional relevant information.  Plan of Care  Orders:  Orders Placed This Encounter  Procedures   LUMBAR FACET(MEDIAL BRANCH NERVE BLOCK) MBNB    Scheduling Instructions:     Procedure: Lumbar facet block (AKA.: Lumbosacral medial branch nerve block)     Side: Bilateral     Level: L3-4 & L5-S1 Facets (L2, L3, L4, L5, & S1 Medial Branch Nerves)     Sedation: Patient's choice.     Timeframe: Today    Order  Specific Question:   Where will this procedure be performed?    Answer:   [facility]  HIP INJECTION    Purpose: Therapeutic/Diagnostic Indication: Hip pain 2ry to Trochanteric Burlitis right (M70.61).    Scheduling Instructions:     Procedure: Trochanteric bursa injection     Laterality: Right-sided     Sedation: With Sedation.     Timeframe: Today   DG PAIN CLIN[facility] C-ARM 1-60 MIN NO REPORT    Intraoperative interpretation by procedural physician at [facility].    Standing Status:   Standing    Number of Occurrences:   1    Order Specific Question:   Reason for exam:    Answer:   Assistance in needle guidance and placement for procedures requiring needle placement in or near specific anatomical locations not easily accessible without such assistance.   Informed Consent Details: Physician/Practitioner Attestation; Transcribe to consent form and obtain patient signature    Nursing Order: Transcribe to consent form and obtain patient signature. Note: Always confirm laterality of pain with Mr. Fairhurst, before procedure.    Order Specific Question:   Physician/Practitioner attestation of informed consent for procedure/surgical case    Answer:   I, the physician/practitioner, attest that I have discussed with the patient the benefits, risks, side effects, alternatives, likelihood of achieving goals and potential problems during recovery for the procedure that I have provided informed consent.    Order Specific Question:   Procedure    Answer:   Lumbar Facet Block  under fluoroscopic guidance    Order Specific Question:   Physician/Practitioner performing the procedure    Answer:   Lior Cartelli A. Laban Emperor MD    Order Specific Question:   Indication/Reason    Answer:   Low Back Pain, with our without leg pain, due to Facet Joint Arthralgia (Joint Pain) Spondylosis (Arthritis of the Spine), without myelopathy or radiculopathy (Nerve Damage).   Provide equipment / supplies at  bedside    Procedure tray: "Block Tray" (Disposable  single use) Skin infiltration needle: Regular 1.5-in, 25-G, (x1) Block Needle type: Spinal Amount/quantity: 4 Size: Regular (3.5-inch) Gauge: 22G    Standing Status:   Standing    Number of Occurrences:   1    Order Specific Question:   Specify    Answer:   Block Tray   Informed Consent Details: Physician/Practitioner Attestation; Transcribe to consent form and obtain patient signature    Note: Always confirm laterality of pain with Mr. Hemenway, before procedure. Transcribe to consent form and obtain patient signature.    Order Specific Question:   Physician/Practitioner attestation of informed consent for procedure/surgical case    Answer:   I, the physician/practitioner, attest that I have discussed with the patient the benefits, risks, side effects, alternatives, likelihood of achieving goals and potential problems during recovery for the procedure that I have provided informed consent.    Order Specific Question:   Procedure    Answer:   Hip bursa injection    Order Specific Question:   Physician/Practitioner performing the procedure    Answer:   Hakiem Malizia A. Laban Emperor, MD    Order Specific Question:   Indication/Reason    Answer:   Hip bursitis   Chronic Opioid Analgesic:  No chronic opioid analgesics therapy prescribed by our practice. Oxycodone/APAP 5/325, 1 tab p.o. twice daily (# 60) (last filled on 12/01/2021) (prescription written by Daniel Nones, MD)  MME/day: 15 mg/day   Medications ordered for procedure: Meds ordered this encounter  Medications   lidocaine (XYLOCAINE) 2 % (with pres) injection 400 mg   pentafluoroprop-tetrafluoroeth (GEBAUERS) aerosol  DISCONTD: lactated ringers infusion   DISCONTD: midazolam (VERSED) 5 MG/5ML injection 0.5-2 mg    Make sure Flumazenil is available in the pyxis when using this medication. If oversedation occurs, administer 0.2 mg IV over 15 sec. If after 45 sec no response, administer  0.2 mg again over 1 min; may repeat at 1 min intervals; not to exceed 4 doses (1 mg)   fentaNYL (SUBLIMAZE) injection 25-50 mcg    Make sure Narcan is available in the pyxis when using this medication. In the event of respiratory depression (RR< 8/min): Titrate NARCAN (naloxone) in increments of 0.1 to 0.2 mg IV at 2-3 minute intervals, until desired degree of reversal.   ropivacaine (PF) 2 mg/mL (0.2%) (NAROPIN) injection 18 mL   triamcinolone acetonide (KENALOG-40) injection 80 mg   methylPREDNISolone acetate (DEPO-MEDROL) injection 80 mg   ropivacaine (PF) 2 mg/mL (0.2%) (NAROPIN) injection 4 mL   DISCONTD: midazolam (VERSED) injection 0.5-2 mg    Make sure Flumazenil is available in the pyxis when using this medication. If oversedation occurs, administer 0.2 mg IV over 15 sec. If after 45 sec no response, administer 0.2 mg again over 1 min; may repeat at 1 min intervals; not to exceed 4 doses (1 mg)   Medications administered: We administered lidocaine, pentafluoroprop-tetrafluoroeth, ropivacaine (PF) 2 mg/mL (0.2%), triamcinolone acetonide, methylPREDNISolone acetate, and ropivacaine (PF) 2 mg/mL (0.2%).  See the medical record for exact dosing, route, and time of administration.  Follow-up plan:   Return in about 2 weeks (around 12/26/2021) for Proc-day (T,Th), (F2F), (PPE).       Interventional Therapies  Risk  Complexity Considerations:       Extensive hardware: No RFA.   Planned  Pending:   Diagnostic x-rays of the lumbar spine with flexion and extension views to evaluate stability of spondylolisthesis. Diagnostic/therapeutic right greater trochanteric bursa injection #1 (12/12/2021)  Diagnostic bilateral lumbar facet MBB #1 (12/12/2021)    Under consideration:   Diagnostic/therapeutic right greater trochanteric bursa injection #1  Diagnostic bilateral lumbar facet MBB #1    Completed:   None at this time   Completed by other providers:   Diagnostic/therapeutic left  L2-3 TFESI x1 (03/04/2017) by Holland Commons, MD [facility])  Diagnostic/therapeutic left IA hip injection x2 (04/12/2017, 05/08/2017) by Holland Commons, MD ([facility])  Diagnostic/therapeutic left L4-5 and L5-S1 TFESI x3 (05/29/2017, 09/20/2017, 12/12/2018) by Holland Commons, MD ([facility])  Diagnostic/therapeutic right L4-5 and L5-S1 TESI x1 (06/26/2019)  by Holland Commons, MD [facility])  Therapeutic left total hip replacement (THR) x1 (09/30/2018) by P.  Dalldorf, MD  Therapeutic left L2-3, L3-4, L4-5 anterior lateral lumbar interbody fusion w/ percutaneous pedicle screw fixation (left) (12/03/2019) by Dr. Maeola Harman    Therapeutic  Palliative (PRN) options:   None established      Recent Visits Date Type Provider Dept  12/06/21 Office Visit Delano Metz, MD [facility]  Showing recent visits within past 90 days and meeting all other requirements Today's Visits Date Type Provider Dept  12/12/21 Procedure visit Delano Metz, MD [facility]  Showing today's visits and meeting all other requirements Future Appointments Date Type Provider Dept  12/21/21 Appointment Delano Metz, MD [facility]  Showing future appointments within next 90 days and meeting all other requirements  Disposition: Discharge home  Discharge (Date  Time): 12/12/2021; 1015 hrs.   Primary Care Physician: Orene Desanctis, MD Location: [facility] Note by: Para March  Erlinda HongA [place] Souter, MD Date: 12/12/2021; Time: 10:57 AM  Disclaimer:  Medicine is not an Visual merchandiserexact science. The only guarantee in medicine is that nothing is guaranteed. It is important to note that the decision to proceed with this intervention was based on the information collected from the patient. The Data and conclusions were drawn from the patient's questionnaire, the interview, and the physical examination. Because the information was  provided in large part by the patient, it cannot be guaranteed that it has not been purposely or unconsciously manipulated. Every effort has been made to obtain as much relevant data as possible for this evaluation. It is important to note that the conclusions that lead to this procedure are derived in large part from the available data. Always take into account that the treatment will also be dependent on availability of resources and existing treatment guidelines, considered by other Pain Management Practitioners as being common knowledge and practice, at the time of the intervention. For Medico-Legal purposes, it is also important to point out that variation in procedural techniques and pharmacological choices are the acceptable norm. The indications, contraindications, technique, and results of the above procedure should only be interpreted and judged by a Board-Certified Interventional Pain Specialist with extensive familiarity and expertise in the same exact procedure and technique.

## 2021-12-12 NOTE — Patient Instructions (Signed)

## 2021-12-13 ENCOUNTER — Telehealth: Payer: Self-pay

## 2021-12-13 NOTE — Telephone Encounter (Signed)
Post procedure follow up.  LM 

## 2021-12-17 NOTE — Progress Notes (Addendum)
PROVIDER NOTE: Information contained herein reflects review and annotations entered in association with encounter. Interpretation of such information and data should be left to medically-trained personnel. Information provided to patient can be located elsewhere in the medical record under "Patient Instructions". Document created using STT-dictation technology, any transcriptional errors that may result from process are unintentional.    Patient: Jonathan Stephens  Service Category: E/M  Provider: Gaspar Cola, MD  DOB: [birth year]  DOS: 12/21/2021  Referring Provider: Barbaraann Boys, MD  MRN: [id number]  Specialty: Interventional Pain Management  PCP: Barbaraann Boys, MD  Type: Established Patient  Setting: Ambulatory outpatient    Location: Office  Delivery: Face-to-face     HPI  [name], a 61 y.o. year old male, is here today because of his Chronic bilateral low back pain without sciatica [M54.50, G89.29]. [name] primary complain today is Back Pain (Lumbar bilateral ) Last encounter: My last encounter with him was on 12/12/2021. Pertinent problems: [name] has Lumbar scoliosis; Intervertebral disc disorder with radiculopathy of lumbar region; Neuropathic pain of feet (Bilateral); Degenerative lumbar spinal stenosis; Chronic pain syndrome; Abnormal MRI, lumbar spine (02/28/2017 & 09/22/2021); Failed back surgical syndrome; Decreased range of motion of lumbar spine; Chronic low back pain (1ry area of Pain) (Bilateral) (R>L) w/o sciatica; Chronic lower extremity pain (2ry area of Pain) (Bilateral) (L>R); Numbness and tingling of lower extremity (Bilateral); DDD (degenerative disc disease), lumbar; Grade 1 Anterolisthesis of lumbar spine (L4/L5); Grade 1 Retrolisthesis of L3/L4; Greater trochanteric bursitis of hip (Right); Lumbosacral facet joint syndrome (Bilateral); Lumbar facet arthropathy (Multilevel) (Bilateral); Lumbar facet joint pain; Osteoarthritis of facet joint of lumbar  spine; Spondylosis without myelopathy or radiculopathy, lumbosacral region; Discogenic low back pain; Chronic hip pain (Right); Chronic sacroiliac joint pain (Right); History of total hip replacement (Left); and Burning pain (feet) on their pertinent problem list. Pain Assessment: Severity of Chronic pain is reported as a 8 /10. Location: Back Lower, Left, Right/no pain in left hip anymore, pain has moved to the back. radiating now down to back of thigh. Onset: More than a month ago. Quality: Discomfort, Constant, Burning, Stabbing. Timing: Constant. Modifying factor(s): denies. Vitals:  height is _0  (1.88 m) and weight is 215 lb (97.5 kg). His temporal temperature is 97.3 F (36.3 C) (abnormal). His blood pressure is 131/67 and his pulse is 81. His respiration is 16 and oxygen saturation is 100%.  BMI: Estimated body mass index is 27.6 kg/m as calculated from the following:   Height as of this encounter: _1  (1.88 m).   Weight as of this encounter: 215 lb (97.5 kg).  Reason for encounter: post-procedure evaluation and assessment.  Initial bleed the patient told the nurse that he had no significant improvement from the diagnostic injections however further questioning has revealed up otherwise.  The patient confirmed that for the duration of the local anesthetic, while the area was numb, he was experiencing 100% relief of the pain.  Furthermore, he indicated that this numbness completely wore off after 3 days meaning that he had 100% relief of the pain for 3 days.  He refers them that the pain on the right side started to come back first.  Further questioning has actually also revealed that to the procedure he was experiencing lower extremity pain which is now completely gone.  This means that the pain that he was experiencing in the lower extremities was referred from the facet joints.  In terms of this lower extremity pain he  has attained 100% ongoing relief of the lower extremity pain.  We then  moved to the area of the hip and the patient indicated having attained 100% relief of the pain for the duration of the local anesthetic which in fact has continued and he is currently experiencing 100% relief of the pain from that trochanteric bursitis.  Today I took the time to explain to the patient the role of the local anesthetics and the steroids and the diagnostic injections and I have interpreted the results for him.  Although his MRI does show different areas of pathology, right now what seems to be significant is the area of the facet joints.  Today I have explained to the patient that the plan is to repeat the diagnostic bilateral lumbar facet blocks and if we again attain similar results, he would normally be considered for radiofrequency ablation. However, the issue that we have is that of the hardware which would make the chances of a successful RFA low. Today I took the time to explain to the patient what this procedure was and what it entailed.  The patient refers having understood and accepted the plan.  Post-procedure evaluation   Type: Lumbar Facet, Medial Branch Block(s) #1  Laterality: Bilateral  Level: L2, L3, L4, L5, and S1 Medial Branch Level(s). Injecting these levels blocks the L3-4, L4-5, and L5-S1 lumbar facet joints. Imaging: Fluoroscopic guidance         Anesthesia: Local anesthesia (1-2% Lidocaine) Anxiolysis: No IV. Sedation: No Sedation                       DOS: 12/12/2021 Performed by: Gaspar Cola, MD  Primary Purpose: Diagnostic/Therapeutic Indications: Low back pain severe enough to impact quality of life or function. 1. Lumbosacral facet joint syndrome (Bilateral)   2. Spondylosis without myelopathy or radiculopathy, lumbosacral region   3. Lumbar facet joint pain   4. Lumbar facet arthropathy (Multilevel) (Bilateral)   5. Grade 1 Retrolisthesis of L3/L4   6. 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 DDD (degenerative disc disease),  lumbar   8. Chronic low back pain (1ry area of Pain) (Bilateral) (R>L) w/o sciatica   9. Failed back surgical syndrome    Procedure #2:            Type: Hip bursa injection #1 (trochanteric bursa) Primary Purpose: Diagnostic/Therapeutric Region: Upper (proximal) Femoral Region Level: Hip Joint Target Area: Trochanteric Bursa Approach: Posterolateral approach Laterality: Right  Position: Prone   1. Greater trochanteric bursitis of hip (Right)   2. Chronic hip pain (Right)    NAS-11 Pain score:   Pre-procedure: 8 /10   Post-procedure: 0-No pain/10      Effectiveness:  Initial hour after procedure: 100 %. Subsequent 4-6 hours post-procedure: 100 %. Analgesia past initial 6 hours: 100 % x 3 days. Ongoing improvement:  Analgesic: Although the pain in the lower back has returned, he continues to experience 100% ongoing relief of the lower extremity pain.  In addition he is also experiencing 100% ongoing relief of the pain from the trochanteric bursa. Function: Somewhat improved ROM: Somewhat improved  Pharmacotherapy Assessment  Analgesic: No chronic opioid analgesics therapy prescribed by our practice. Oxycodone/APAP 5/325, 1 tab p.o. twice daily (# 60) (last filled on 12/01/2021) (prescription written by Ane Payment, MD)  MME/day: 15 mg/day   Monitoring: [place] PMP: PDMP reviewed during this encounter.       Pharmacotherapy: No side-effects or adverse reactions  reported. Compliance: No problems identified. Effectiveness: Clinically acceptable.  Janett Billow, RN  12/21/2021  1:55 PM  Sign when Signing Visit Safety precautions to be maintained throughout the outpatient stay will include: orient to surroundings, keep bed in low position, maintain call bell within reach at all times, provide assistance with transfer out of bed and ambulation.     No results found for: "CBDTHCR" No results found for: "D8THCCBX" No results found for: "D9THCCBX"  UDS:  Summary  Date Value  Ref Range Status  09/11/2021 Note  Final    Comment:    ==================================================================== Compliance Drug Analysis, Ur ==================================================================== Test                             Result       Flag       Units  Drug Present and Declared for Prescription Verification   Noroxycodone                   869          EXPECTED   ng/mg creat   Noroxymorphone                 39           EXPECTED   ng/mg creat    Noroxycodone and noroxymorphone are expected metabolites of    oxycodone. Noroxymorphone is an expected metabolite of oxymorphone.    Sources of oxycodone and/or oxymorphone are scheduled prescription    medications.    Gabapentin                     PRESENT      EXPECTED   Mirtazapine                    PRESENT      EXPECTED   Acetaminophen                  PRESENT      EXPECTED  Drug Present not Declared for Prescription Verification   Carboxy-THC                    15           UNEXPECTED ng/mg creat    Carboxy-THC is a metabolite of tetrahydrocannabinol (THC). Source of    THC is most commonly herbal marijuana or marijuana-based products,    but THC is also present in a scheduled prescription medication.    Trace amounts of THC can be present in hemp and cannabidiol (CBD)    products. This test is not intended to distinguish between delta-9-    tetrahydrocannabinol, the predominant form of THC in most herbal or    marijuana-based products, and delta-8-tetrahydrocannabinol.    Duloxetine                     PRESENT      UNEXPECTED   Trazodone                      PRESENT      UNEXPECTED   1,3 chlorophenyl piperazine    PRESENT      UNEXPECTED    1,3-chlorophenyl piperazine is an expected metabolite of trazodone.    Ibuprofen                      PRESENT  UNEXPECTED  Drug Absent but Declared for Prescription Verification   Oxycodone                      Not Detected UNEXPECTED ng/mg creat     Oxycodone is almost always present in patients taking this drug    consistently.  Absence of oxycodone could be due to lapse of time    since the last dose or unusual pharmacokinetics (rapid metabolism).  ==================================================================== Test                      Result    Flag   Units      Ref Range   Creatinine              164              mg/dL      >=20 ==================================================================== Declared Medications:  The flagging and interpretation on this report are based on the  following declared medications.  Unexpected results may arise from  inaccuracies in the declared medications.   **Note: The testing scope of this panel includes these medications:   Gabapentin (Neurontin)  Mirtazapine (Remeron)  Oxycodone (Percocet)   **Note: The testing scope of this panel does not include small to  moderate amounts of these reported medications:   Acetaminophen (Percocet)   **Note: The testing scope of this panel does not include the  following reported medications:   Amlodipine (Norvasc)  Atorvastatin (Lipitor)  Fish Oil  Iron  Multivitamin  Simvastatin (Zocor)  Vitamin E ==================================================================== For clinical consultation, please call [phone]. ====================================================================       ROS  Constitutional: Denies any fever or chills Gastrointestinal: No reported hemesis, hematochezia, vomiting, or acute GI distress Musculoskeletal: Denies any acute onset joint swelling, redness, loss of ROM, or weakness Neurological: No reported episodes of acute onset apraxia, aphasia, dysarthria, agnosia, amnesia, paralysis, loss of coordination, or loss of consciousness  Medication Review  DULoxetine, EPINEPHrine, Fish Oil, Potassium Chloride ER, amLODipine, atorvastatin, ferrous sulfate, fluticasone, gabapentin, ibuprofen, multivitamin  with minerals, naloxone, oxyCODONE-acetaminophen, sertraline, traZODone, and vitamin E  History Review  Allergy: [name] is allergic to bee venom. Drug: [name]  reports no history of drug use. Alcohol:  reports that he does not currently use alcohol. Tobacco:  reports that he has been smoking e-cigarettes. He has never used smokeless tobacco. Social: [name]  reports that he has been smoking e-cigarettes. He has never used smokeless tobacco. He reports that he does not currently use alcohol. He reports that he does not use drugs. Medical:  has a past medical history of Hepatitis. Surgical: [name]  has a past surgical history that includes Mohs surgery (03/2018); Total hip arthroplasty (Left, 09/30/2018); Tonsillectomy; Anterior lat lumbar fusion (Left, 12/03/2019); and Lumbar percutaneous pedicle screw 3 level (N/A, 12/03/2019). Family: family history is not on file.  Laboratory Chemistry Profile   Renal Lab Results  Component Value Date   BUN 7 (L) 09/11/2021   CREATININE 0.89 09/11/2021   BCR 8 (L) 09/11/2021   GFRAA >60 09/09/2019   GFRNONAA >60 12/01/2019    Hepatic Lab Results  Component Value Date   AST 15 09/11/2021   ALT 19 12/01/2019   ALBUMIN 4.6 09/11/2021   ALKPHOS 130 (H) 09/11/2021    Electrolytes Lab Results  Component Value Date   NA 143 09/11/2021   K 2.9 (L) 09/11/2021   CL 97 09/11/2021   CALCIUM 9.6 09/11/2021  MG 1.9 09/11/2021    Bone Lab Results  Component Value Date   25OHVITD1 59 09/11/2021   25OHVITD2 <1.0 09/11/2021   25OHVITD3 59 09/11/2021    Inflammation (CRP: Acute Phase) (ESR: Chronic Phase) Lab Results  Component Value Date   CRP 9 09/11/2021   ESRSEDRATE 32 (H) 09/11/2021         Note: Above Lab results reviewed.  Recent Imaging Review  DG PAIN CLINIC C-ARM 1-60 MIN NO REPORT Fluoro was used, but no Radiologist interpretation will be provided.  Please refer to "NOTES" tab for provider progress note. Note: Reviewed         Physical Exam  General appearance: Well nourished, well developed, and well hydrated. In no apparent acute distress Mental status: Alert, oriented x 3 (person, place, & time)       Respiratory: No evidence of acute respiratory distress Eyes: PERLA Vitals: BP 131/67 (BP Location: Right Arm, Patient Position: Sitting, Cuff Size: Normal)   Pulse 81   Temp (!) 97.3 F (36.3 C) (Temporal)   Resp 16   Ht _0  (1.88 m)   Wt 215 lb (97.5 kg)   SpO2 100%   BMI 27.60 kg/m  BMI: Estimated body mass index is 27.6 kg/m as calculated from the following:   Height as of this encounter: _1  (1.88 m).   Weight as of this encounter: 215 lb (97.5 kg). Ideal: Ideal body weight: 82.2 kg (181 lb 3.5 oz) Adjusted ideal body weight: 88.3 kg (194 lb 11.7 oz)  Assessment   Diagnosis Status  1. Chronic low back pain (1ry area of Pain) (Bilateral) (R>L) w/o sciatica   2. Lumbosacral facet joint syndrome (Bilateral)   3. Failed back surgical syndrome   4. Chronic pain syndrome   5. Grade 1 Retrolisthesis of L3/L4   6. 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 Lumbar facet joint pain   8. Lumbar facet arthropathy (Multilevel) (Bilateral)   9. Abnormal MRI, lumbar spine (02/28/2017 & 09/22/2021)    Controlled Controlled Controlled   Updated Problems: No problems updated.  Plan of Care  Problem-specific:  No problem-specific Assessment & Plan notes found for this encounter.  [name] has a current medication list which includes the following long-term medication(s): amlodipine, atorvastatin, duloxetine, ferrous sulfate, fluticasone, gabapentin, potassium chloride er, sertraline, trazodone, and trazodone.  Pharmacotherapy (Medications Ordered): No orders of the defined types were placed in this encounter.  Orders:  Orders Placed This Encounter  Procedures   LUMBAR FACET(MEDIAL BRANCH NERVE BLOCK) MBNB    Standing Status:   Future    Standing Expiration Date:    03/22/2022    Scheduling Instructions:     Procedure: Lumbar facet block (AKA.: Lumbosacral medial branch nerve block)     Side: Bilateral     Level: L3-4, L4-5, and L5-S1 Facets (L2, L3, L4, L5, and S1 Medial Branch)     Sedation: Patient's choice.     Timeframe: ASAA    Order Specific Question:   Where will this procedure be performed?    Answer:   [facility]   Follow-up plan:   Return for (Clinic): (B) L-FCT BLK #2.     Interventional Therapies  Risk  Complexity Considerations:       Extensive hardware: No RFA.   Planned  Pending:   Diagnostic x-rays of the lumbar spine with flexion and extension views to evaluate stability of spondylolisthesis. Diagnostic/therapeutic right greater trochanteric bursa injection #1 (12/12/2021)  Diagnostic bilateral lumbar facet MBB #1 (12/12/2021)    Under consideration:   Diagnostic/therapeutic right greater trochanteric bursa injection #1  Diagnostic bilateral lumbar facet MBB #1    Completed:   None at this time   Completed by other providers:   Diagnostic/therapeutic left L2-3 TFESI x1 (03/04/2017) by Etheleen Mayhew, MD ([place])  [place] left IA hip injection x2 (04/12/2017, 05/08/2017) by Etheleen Mayhew, MD ([place])  [place] left L4-5 and L5-S1 TFESI x3 (05/29/2017, 09/20/2017, 12/12/2018) by Etheleen Mayhew, MD ([place])  Diagnostic/therapeutic right L4-5 and L5-S1 TESI x1 (06/26/2019)  by Etheleen Mayhew, MD ([place])  Therapeutic left total hip replacement (THR) x1 (09/30/2018) by P.  Dalldorf, MD  Therapeutic left L2-3, L3-4, L4-5 anterior lateral lumbar interbody fusion w/ percutaneous pedicle screw fixation (left) (12/03/2019) by Dr. Erline Levine    Therapeutic  Palliative (PRN) options:   None established       Recent Visits Date Type Provider Dept  12/12/21 Procedure visit Milinda Pointer, MD [facility]  12/06/21 Office  Visit Milinda Pointer, MD [facility]  Showing recent visits within past 90 days and meeting all other requirements Today's Visits Date Type Provider Dept  12/21/21 Office Visit Milinda Pointer, MD [facility]  Showing today's visits and meeting all other requirements Future Appointments Date Type Provider Dept  01/11/22 Appointment Milinda Pointer, MD [facility]  Showing future appointments within next 90 days and meeting all other requirements  I discussed the assessment and treatment plan with the patient. The patient was provided an opportunity to ask questions and all were answered. The patient agreed with the plan and demonstrated an understanding of the instructions.  Patient advised to call back or seek an in-person evaluation if the symptoms or condition worsens.  Duration of encounter: 33 minutes.  Total time on encounter, as per AMA guidelines included both the face-to-face and non-face-to-face time personally spent by the physician and/or other qualified health care professional(s) on the day of the encounter (includes time in activities that require the physician or other qualified health care professional and does not include time in activities normally performed by clinical staff). Physician's time may include the following activities when performed: preparing to see the patient (eg, review of tests, pre-charting review of records) obtaining and/or reviewing separately obtained history performing a medically appropriate examination and/or evaluation counseling and educating the patient/family/caregiver ordering medications, tests, or procedures referring and communicating with other health care professionals (when not separately reported) documenting clinical information in the electronic or other health record independently interpreting results (not separately reported) and communicating results to the patient/ family/caregiver care  coordination (not separately reported)  Note by: Gaspar Cola, MD Date: 12/21/2021; Time: 2:43 PM

## 2021-12-21 ENCOUNTER — Encounter: Payer: Self-pay | Admitting: Pain Medicine

## 2021-12-21 ENCOUNTER — Ambulatory Visit: Payer: Federal, State, Local not specified - PPO | Attending: Pain Medicine | Admitting: Pain Medicine

## 2021-12-21 VITALS — BP 131/67 | HR 81 | Temp 97.3°F | Resp 16 | Ht 74.0 in | Wt 215.0 lb

## 2021-12-21 DIAGNOSIS — M47816 Spondylosis without myelopathy or radiculopathy, lumbar region: Secondary | ICD-10-CM

## 2021-12-21 DIAGNOSIS — G8929 Other chronic pain: Secondary | ICD-10-CM

## 2021-12-21 DIAGNOSIS — M5459 Other low back pain: Secondary | ICD-10-CM

## 2021-12-21 DIAGNOSIS — G894 Chronic pain syndrome: Secondary | ICD-10-CM | POA: Diagnosis not present

## 2021-12-21 DIAGNOSIS — M961 Postlaminectomy syndrome, not elsewhere classified: Secondary | ICD-10-CM | POA: Diagnosis not present

## 2021-12-21 DIAGNOSIS — R937 Abnormal findings on diagnostic imaging of other parts of musculoskeletal system: Secondary | ICD-10-CM

## 2021-12-21 DIAGNOSIS — M545 Low back pain, unspecified: Secondary | ICD-10-CM | POA: Diagnosis not present

## 2021-12-21 DIAGNOSIS — M4316 Spondylolisthesis, lumbar region: Secondary | ICD-10-CM

## 2021-12-21 DIAGNOSIS — M47817 Spondylosis without myelopathy or radiculopathy, lumbosacral region: Secondary | ICD-10-CM | POA: Diagnosis not present

## 2021-12-21 DIAGNOSIS — M4310 Spondylolisthesis, site unspecified: Secondary | ICD-10-CM

## 2021-12-21 NOTE — Progress Notes (Signed)
Safety precautions to be maintained throughout the outpatient stay will include: orient to surroundings, keep bed in low position, maintain call bell within reach at all times, provide assistance with transfer out of bed and ambulation.  

## 2021-12-21 NOTE — Patient Instructions (Signed)

## 2022-01-07 NOTE — Progress Notes (Unsigned)
PROVIDER NOTE: Interpretation of information contained herein should be left to medically-trained personnel. Specific patient instructions are provided elsewhere under "Patient Instructions" section of medical record. This document was created in part using STT-dictation technology, any transcriptional errors that may result from this process are unintentional.  Patient: Jonathan Stephens Type: Established DOB: 1960-01-26 MRN: [id number] PCP: Barbaraann Boys, MD  Service: Procedure DOS: 01/11/2022 Setting: Ambulatory Location: Ambulatory outpatient facility Delivery: Face-to-face Provider: Gaspar Cola, MD Specialty: Interventional Pain Management Specialty designation: 09 Location: Outpatient facility Ref. Prov.: Barbaraann Boys, MD    Procedure:           Type: Lumbar Facet, Medial Branch Block(s) #2  Laterality: Bilateral  Level: L4, L5, and S1 Medial Branch Level(s). Injecting these levels blocks the L5-S1 lumbar facet joints. Imaging: Fluoroscopic guidance         Anesthesia: Local anesthesia (1-2% Lidocaine) Anxiolysis: None Sedation: No Sedation                       DOS: 01/11/2022 Performed by: Gaspar Cola, MD  Primary Purpose: Diagnostic/Therapeutic Indications: Low back pain severe enough to impact quality of life or function. 1. Lumbar facet joint pain   2. Spondylosis without myelopathy or radiculopathy, lumbosacral region   3. Grade 1 Retrolisthesis of L3/L4   4. Grade 1 Anterolisthesis of lumbar spine (L4/L5)   5. Lumbosacral facet joint syndrome (Bilateral)   6. DDD (degenerative disc disease), lumbar   7. Chronic low back pain (1ry area of Pain) (Bilateral) (R>L) w/o sciatica   8. Fixation hardware in spine    NAS-11 Pain score:   Pre-procedure: 7 /10   Post-procedure: 2/10 (Right) 0/10 (Left)  Note: Today prior to the procedure we asked the patient to tell us where his worst pain was located in terms of his lower back.  We drew a line as to where the  most cephalad portion of the pain was located and then I limited the block to the facet joints in that area.  When I used fluoroscopy to identify the area, it was clear that the primary area seem to be that of the L5-S1 facet joint.  Since his last pedicle screw is at L5, this would suggest that we are dealing with "adjacent level disease".  Today I have concentrated on blocking just the L5-S1 facet joint.        Position / Prep / Materials:  Position: Prone  Prep solution: DuraPrep (Iodine Povacrylex [0.7% available iodine] and Isopropyl Alcohol, 74% w/w) Area Prepped: Posterolateral Lumbosacral Spine (Wide prep: From the lower border of the scapula down to the end of the tailbone and from flank to flank.)  Materials:  Tray: Block Needle(s):  Type: Spinal  Gauge (G): 22  Length: 5-in Qty: 4     Pre-op H&P Assessment:  Mr. Conquest is a 62 y.o. (year old), male patient, seen today for interventional treatment. 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 Mr. Stanbery has a current medication list which includes the following prescription(s): amlodipine, atorvastatin, duloxetine, epinephrine, ferrous sulfate, fluticasone, gabapentin, ibuprofen, multivitamin with minerals, naloxone, fish oil, oxycodone-acetaminophen, potassium chloride er, sertraline, trazodone, trazodone, and vitamin e. His primarily concern today is the Back Pain  Initial Vital Signs:  Pulse/HCG Rate: 91ECG Heart Rate: 87 Temp: (!) 97.3 F (36.3 C) Resp: 16  BP: 124/77 SpO2: 99 %  BMI: Estimated body mass index is 27.6 kg/m as calculated from the following:   Height as of this encounter: 6\' 2"  (1.88 m).   Weight as of this encounter: 215 lb (97.5 kg).  Risk Assessment: Allergies: Reviewed. He is allergic to bee venom.  Allergy  Precautions: None required Coagulopathies: Reviewed. None identified.  Blood-thinner therapy: None at this time Active Infection(s): Reviewed. None identified. Mr. Yack is afebrile  Site Confirmation: Mr. Thong was asked to confirm the procedure and laterality before marking the site Procedure checklist: Completed Consent: Before the procedure and under the influence of no sedative(s), amnesic(s), or anxiolytics, the patient was informed of the treatment options, risks and possible complications. To fulfill our ethical and legal obligations, as recommended by the American Medical Association's Code of Ethics, I have informed the patient of my clinical impression; the nature and purpose of the treatment or procedure; the risks, benefits, and possible complications of the intervention; the alternatives, including doing nothing; the risk(s) and benefit(s) of the alternative treatment(s) or procedure(s); and the risk(s) and benefit(s) of doing nothing. The patient was provided information about the general risks and possible complications associated with the procedure. These may include, but are not limited to: failure to achieve desired goals, infection, bleeding, organ or nerve damage, allergic reactions, paralysis, and death. In addition, the patient was informed of those risks and complications associated to Spine-related procedures, such as failure to decrease pain; infection (i.e.: Meningitis, epidural or intraspinal abscess); bleeding (i.e.: epidural hematoma, subarachnoid hemorrhage, or any other type of intraspinal or peri-dural bleeding); organ or nerve damage (i.e.: Any type of peripheral nerve, nerve root, or spinal cord injury) with subsequent damage to sensory, motor, and/or autonomic systems, resulting in permanent pain, numbness, and/or weakness of one or several areas of the body; allergic reactions; (i.e.: anaphylactic reaction); and/or death. Furthermore, the patient was informed of those  risks and complications associated with the medications. These include, but are not limited to: allergic reactions (i.e.: anaphylactic or anaphylactoid reaction(s)); adrenal axis suppression; blood sugar elevation that in diabetics may result in ketoacidosis or comma; water retention that in patients with history of congestive heart failure may result in shortness of breath, pulmonary edema, and decompensation with resultant heart failure; weight gain; swelling or edema; medication-induced neural toxicity; particulate matter embolism and blood vessel occlusion with resultant organ, and/or nervous system infarction; and/or aseptic necrosis of one or more joints. Finally, the patient was informed that Medicine is not an exact science; therefore, there is also the possibility of unforeseen or unpredictable risks and/or possible complications that may result in a catastrophic outcome. The patient indicated having understood very clearly. We have given the patient no guarantees and we have made no promises. Enough time was given to the patient to ask questions, all of which were answered to the patient's satisfaction. Mr. Shields has indicated that he wanted to continue with the procedure. Attestation: I, the ordering provider, attest that I have discussed with the patient the benefits, risks, side-effects, alternatives, likelihood of achieving goals, and potential problems during recovery for the procedure that I have provided informed consent. Date  Time: 01/11/2022  9:37 AM  Pre-Procedure Preparation:  Monitoring: As per clinic protocol. Respiration, ETCO2, SpO2, BP, heart rate and rhythm monitor placed and checked for adequate function Safety Precautions: Patient was assessed for positional comfort and pressure points before starting the procedure. Time-out: I initiated and conducted the "Time-out" before starting the procedure, as per protocol.  The patient was asked to participate by confirming the accuracy of  the "Time Out" information. Verification of the correct person, site, and procedure were performed and confirmed by me, the nursing staff, and the patient. "Time-out" conducted as per Joint Commission's Universal Protocol (UP.01.01.01). Time: 1001  Description of Procedure:          Laterality: Bilateral. The procedure was performed in identical fashion on both sides. Targeted Levels: L4, L5, and S1  Medial Branch Level(s)  Safety Precautions: Aspiration looking for blood return was conducted prior to all injections. At no point did we inject any substances, as a needle was being advanced. Before injecting, the patient was told to immediately notify me if he was experiencing any new onset of "ringing in the ears, or metallic taste in the mouth". No attempts were made at seeking any paresthesias. Safe injection practices and needle disposal techniques used. Medications properly checked for expiration dates. SDV (single dose vial) medications used. After the completion of the procedure, all disposable equipment used was discarded in the proper designated medical waste containers. Local Anesthesia: Protocol guidelines were followed. The patient was positioned over the fluoroscopy table. The area was prepped in the usual manner. The time-out was completed. The target area was identified using fluoroscopy. A 12-in long, straight, sterile hemostat was used with fluoroscopic guidance to locate the targets for each level blocked. Once located, the skin was marked with an approved surgical skin marker. Once all sites were marked, the skin (epidermis, dermis, and hypodermis), as well as deeper tissues (fat, connective tissue and muscle) were infiltrated with a small amount of a short-acting local anesthetic, loaded on a 10cc syringe with a 25G, 1.5-in  Needle. An appropriate amount of time was allowed for local anesthetics to take effect before proceeding to the next step. Local Anesthetic: Lidocaine 2.0% The unused  portion of the local anesthetic was discarded in the proper designated containers. Technical description of process:   L4 Medial Branch Nerve Block (MBB): The target area for the L4 medial branch is at the junction of the postero-lateral aspect of the superior articular process and the superior, posterior, and medial edge of the transverse process of L5. Under fluoroscopic guidance, a Quincke needle was inserted until contact was made with os over the superior postero-lateral aspect of the pedicular shadow (target area). After negative aspiration for blood, 0.5 mL of the nerve block solution was injected without difficulty or complication. The needle was removed intact. L5 Medial Branch Nerve Block (MBB): The target area for the L5 medial branch is at the junction of the postero-lateral aspect of the superior articular process and the superior, posterior, and medial edge of the sacral ala. Under fluoroscopic guidance, a Quincke needle was inserted until contact was made with os over the superior postero-lateral aspect of the pedicular shadow (target area). After negative aspiration for blood, 0.5 mL of the nerve block solution was injected without difficulty or complication. The needle was removed intact. S1 Medial Branch Nerve Block (MBB): The target area for the S1 medial branch is at the posterior and inferior 6 o'clock position of the L5-S1 facet joint. Under fluoroscopic guidance, the Quincke needle inserted for the L5 MBB was redirected until contact was made with os over the inferior and postero aspect of the sacrum, at the 6 o' clock position under the L5-S1 facet joint (Target area). After negative aspiration for blood, 0.5 mL of the nerve block solution was injected without difficulty or complication. The needle was  removed intact.  Once the entire procedure was completed, the treated area was cleaned, making sure to leave some of the prepping solution back to take advantage of its long term  bactericidal properties.         Illustration of the posterior view of the lumbar spine and the posterior neural structures. Laminae of L2 through S1 are labeled. DPRL5, dorsal primary ramus of L5; DPRS1, dorsal primary ramus of S1; DPR3, dorsal primary ramus of L3; FJ, facet (zygapophyseal) joint L3-L4; I, inferior articular process of L4; LB1, lateral branch of dorsal primary ramus of L1; IAB, inferior articular branches from L3 medial branch (supplies L4-L5 facet joint); IBP, intermediate branch plexus; MB3, medial branch of dorsal primary ramus of L3; NR3, third lumbar nerve root; S, superior articular process of L5; SAB, superior articular branches from L4 (supplies L4-5 facet joint also); TP3, transverse process of L3.  Vitals:   01/11/22 0958 01/11/22 1002 01/11/22 1007 01/11/22 1010  BP: (!) 140/85 121/86 117/73 118/72  Pulse:      Resp: 17 17 17 16   Temp:      SpO2: 98% 93% 97% 96%  Weight:      Height:         Start Time: 1001 hrs. End Time: 1009 hrs.  Imaging Guidance (Spinal):          Type of Imaging Technique: Fluoroscopy Guidance (Spinal) Indication(s): Assistance in needle guidance and placement for procedures requiring needle placement in or near specific anatomical locations not easily accessible without such assistance. Exposure Time: Please see nurses notes. Contrast: None used. Fluoroscopic Guidance: I was personally present during the use of fluoroscopy. "Tunnel Vision Technique" used to obtain the best possible view of the target area. Parallax error corrected before commencing the procedure. "Direction-depth-direction" technique used to introduce the needle under continuous pulsed fluoroscopy. Once target was reached, antero-posterior, oblique, and lateral fluoroscopic projection used confirm needle placement in all planes. Images permanently stored in EMR. Interpretation: No contrast injected. I personally interpreted the imaging intraoperatively. Adequate needle  placement confirmed in multiple planes. Permanent images saved into the patient's record.  Antibiotic Prophylaxis:   Anti-infectives (From admission, onward)    None      Indication(s): None identified  Post-operative Assessment:  Post-procedure Vital Signs:  Pulse/HCG Rate: 9187 Temp: (!) 97.3 F (36.3 C) Resp: 16 BP: 118/72 SpO2: 96 %  EBL: None  Complications: No immediate post-treatment complications observed by team, or reported by patient.  Note: The patient tolerated the entire procedure well. A repeat set of vitals were taken after the procedure and the patient was kept under observation following institutional policy, for this type of procedure. Post-procedural neurological assessment was performed, showing return to baseline, prior to discharge. The patient was provided with post-procedure discharge instructions, including a section on how to identify potential problems. Should any problems arise concerning this procedure, the patient was given instructions to immediately contact , at any time, without hesitation. In any case, we plan to contact the patient by telephone for a follow-up status report regarding this interventional procedure.  Comments:  No additional relevant information.  Plan of Care  Orders:  Orders Placed This Encounter  Procedures   LUMBAR FACET(MEDIAL BRANCH NERVE BLOCK) MBNB    Scheduling Instructions:     Procedure: Lumbar facet block (AKA.: Lumbosacral medial branch nerve block)     Side: Bilateral     Level: TBD Facets (TBD Medial Branch Nerves)     Sedation: Patient's choice.  Timeframe: Today    Order Specific Question:   Where will this procedure be performed?    Answer:   [facility]   DG PAIN [facility] C-ARM 1-60 MIN NO REPORT    Intraoperative interpretation by procedural physician at [place].    Standing Status:   Standing    Number of Occurrences:   1    Order Specific Question:   Reason for exam:     Answer:   Assistance in needle guidance and placement for procedures requiring needle placement in or near specific anatomical locations not easily accessible without such assistance.   Informed Consent Details: Physician/Practitioner Attestation; Transcribe to consent form and obtain patient signature    Nursing Order: Transcribe to consent form and obtain patient signature. Note: Always confirm laterality of pain with Mr. Frilot, before procedure.    Order Specific Question:   Physician/Practitioner attestation of informed consent for procedure/surgical case    Answer:   I, the physician/practitioner, attest that I have discussed with the patient the benefits, risks, side effects, alternatives, likelihood of achieving goals and potential problems during recovery for the procedure that I have provided informed consent.    Order Specific Question:   Procedure    Answer:   Lumbar Facet Block  under fluoroscopic guidance    Order Specific Question:   Physician/Practitioner performing the procedure    Answer:   Rubee Vega A. Dossie Arbour MD    Order Specific Question:   Indication/Reason    Answer:   Low Back Pain, with our without leg pain, due to Facet Joint Arthralgia (Joint Pain) Spondylosis (Arthritis of the Spine), without myelopathy or radiculopathy (Nerve Damage).   Provide equipment / supplies at bedside    Procedure tray: "Block Tray" (Disposable  single use) Skin infiltration needle: Regular 1.5-in, 25-G, (x1) Block Needle type: Spinal Amount/quantity: 4 Size: Regular (3.5-inch) Gauge: 22G    Standing Status:   Standing    Number of Occurrences:   1    Order Specific Question:   Specify    Answer:   Block Tray   Chronic Opioid Analgesic:  No chronic opioid analgesics therapy prescribed by our practice. Oxycodone/APAP 5/325, 1 tab p.o. twice daily (# 60) (last filled on 12/01/2021) (prescription written by Ane Payment, MD)  MME/day: 15 mg/day   Medications ordered for procedure: Meds  ordered this encounter  Medications   lidocaine (XYLOCAINE) 2 % (with pres) injection 400 mg   pentafluoroprop-tetrafluoroeth (GEBAUERS) aerosol   ropivacaine (PF) 2 mg/mL (0.2%) (NAROPIN) injection 18 mL   triamcinolone acetonide (KENALOG-40) injection 80 mg   Medications administered: We administered lidocaine, pentafluoroprop-tetrafluoroeth, ropivacaine (PF) 2 mg/mL (0.2%), and triamcinolone acetonide.  See the medical record for exact dosing, route, and time of administration.  Follow-up plan:   Return in about 2 weeks (around 01/25/2022) for Proc-day (T,Th), (VV), (PPE).       Interventional Therapies  Risk Factors  Considerations:       Extensive Lumbar HARDWARE   Planned  Pending:      Under consideration:   Diagnostic/therapeutic right greater trochanteric bursa injection #1  Diagnostic bilateral lumbar facet MBB #2    Completed:   Diagnostic/therapeutic right greater trochanteric bursa inj. x1 (12/12/2021) (100/100/100/100)  Diagnostic bilateral lumbar facet MBB (L2-S1) x1 (12/12/2021) (8 to 0/10) (100/100/100 x 3 days/LBP:0 LEP:100)  Diagnostic bilateral lumbar facet MBB (L4-S1) #2 (01/11/2022) (7 to R:2/10 L:0/10)    Completed by other providers:   Diagnostic/therapeutic left L2-3 TFESI x1 (03/04/2017)  by Etheleen Mayhew, MD ([place])  [place] left IA hip injection x2 (04/12/2017, 05/08/2017) by Etheleen Mayhew, MD ([place])  [employer] left L4-5 and L5-S1 TFESI x3 (05/29/2017, 09/20/2017, 12/12/2018) by Etheleen Mayhew, MD ([place])  [place] right L4-5 and L5-S1 TESI x1 (06/26/2019)  by Etheleen Mayhew, MD ([place])  Therapeutic left total hip replacement (THR) x1 (09/30/2018) by P.  Dalldorf, MD  Therapeutic left L2-3, L3-4, L4-5 anterior lateral lumbar interbody fusion w/ percutaneous pedicle screw fixation (left) (12/03/2019) by Dr. Erline Levine     Therapeutic   Palliative (PRN) options:   None established        Recent Visits Date Type Provider Dept  12/21/21 Office Visit Milinda Pointer, MD [facility]  12/12/21 Procedure visit Milinda Pointer, MD [facility]  12/06/21 Office Visit Milinda Pointer, MD [facility]  Showing recent visits within past 90 days and meeting all other requirements Today's Visits Date Type Provider Dept  01/11/22 Procedure visit Milinda Pointer, MD [facility]  Showing today's visits and meeting all other requirements Future Appointments Date Type Provider Dept  01/25/22 Appointment Milinda Pointer, MD [facility]  Showing future appointments within next 90 days and meeting all other requirements  Disposition: Discharge home  Discharge (Date  Time): 01/11/2022; 1017 hrs.   Primary Care Physician: Barbaraann Boys, MD Location: [facility] Note by: Gaspar Cola, MD Date: 01/11/2022; Time: 10:38 AM  Disclaimer:  Medicine is not an exact science. The only guarantee in medicine is that nothing is guaranteed. It is important to note that the decision to proceed with this intervention was based on the information collected from the patient. The Data and conclusions were drawn from the patient's questionnaire, the interview, and the physical examination. Because the information was provided in large part by the patient, it cannot be guaranteed that it has not been purposely or unconsciously manipulated. Every effort has been made to obtain as much relevant data as possible for this evaluation. It is important to note that the conclusions that lead to this procedure are derived in large part from the available data. Always take into account that the treatment will also be dependent on availability of resources and existing treatment guidelines, considered by other Pain Management Practitioners as being common knowledge and  practice, at the time of the intervention. For Medico-Legal purposes, it is also important to point out that variation in procedural techniques and pharmacological choices are the acceptable norm. The indications, contraindications, technique, and results of the above procedure should only be interpreted and judged by a Board-Certified Interventional Pain Specialist with extensive familiarity and expertise in the same exact procedure and technique.

## 2022-01-11 ENCOUNTER — Encounter: Payer: Self-pay | Admitting: Pain Medicine

## 2022-01-11 ENCOUNTER — Ambulatory Visit
Admission: RE | Admit: 2022-01-11 | Discharge: 2022-01-11 | Disposition: A | Discharge status: Home / Self Care | Payer: Federal, State, Local not specified - PPO | Source: Ambulatory Visit | Attending: Pain Medicine | Admitting: Pain Medicine

## 2022-01-11 ENCOUNTER — Ambulatory Visit: Payer: Federal, State, Local not specified - PPO | Attending: Pain Medicine | Admitting: Pain Medicine

## 2022-01-11 VITALS — BP 118/72 | HR 91 | Temp 97.3°F | Resp 16 | Ht 74.0 in | Wt 215.0 lb

## 2022-01-11 DIAGNOSIS — M5459 Other low back pain: Secondary | ICD-10-CM | POA: Insufficient documentation

## 2022-01-11 DIAGNOSIS — M545 Low back pain, unspecified: Secondary | ICD-10-CM | POA: Diagnosis present

## 2022-01-11 DIAGNOSIS — G8929 Other chronic pain: Secondary | ICD-10-CM | POA: Diagnosis present

## 2022-01-11 DIAGNOSIS — M47817 Spondylosis without myelopathy or radiculopathy, lumbosacral region: Secondary | ICD-10-CM | POA: Insufficient documentation

## 2022-01-11 DIAGNOSIS — Z967 Presence of other bone and tendon implants: Secondary | ICD-10-CM | POA: Insufficient documentation

## 2022-01-11 DIAGNOSIS — M5136 Other intervertebral disc degeneration, lumbar region: Secondary | ICD-10-CM | POA: Insufficient documentation

## 2022-01-11 DIAGNOSIS — M4316 Spondylolisthesis, lumbar region: Secondary | ICD-10-CM | POA: Insufficient documentation

## 2022-01-11 DIAGNOSIS — M4310 Spondylolisthesis, site unspecified: Secondary | ICD-10-CM | POA: Insufficient documentation

## 2022-01-11 MED ORDER — TRIAMCINOLONE ACETONIDE 40 MG/ML IJ SUSP
80.0000 mg | Freq: Once | INTRAMUSCULAR | Status: AC
  Administered 2022-01-11: 80 mg
  Filled 2022-01-11: qty 2

## 2022-01-11 MED ORDER — LIDOCAINE HCL 2 % IJ SOLN
20.0000 mL | Freq: Once | INTRAMUSCULAR | Status: AC
  Administered 2022-01-11: 400 mg
  Filled 2022-01-11: qty 20

## 2022-01-11 MED ORDER — ROPIVACAINE HCL 2 MG/ML IJ SOLN
18.0000 mL | Freq: Once | INTRAMUSCULAR | Status: AC
  Administered 2022-01-11: 18 mL via PERINEURAL
  Filled 2022-01-11: qty 20

## 2022-01-11 MED ORDER — PENTAFLUOROPROP-TETRAFLUOROETH EX AERO
INHALATION_SPRAY | Freq: Once | CUTANEOUS | Status: AC
  Administered 2022-01-11: 1 via TOPICAL

## 2022-01-11 NOTE — Progress Notes (Signed)
Safety precautions to be maintained throughout the outpatient stay will include: orient to surroundings, keep bed in low position, maintain call bell within reach at all times, provide assistance with transfer out of bed and ambulation.  

## 2022-01-11 NOTE — Patient Instructions (Signed)
____________________________________________________________________________________________  Virtual Visits   ID our calls: Add these numbers to your list of contacts on your smart phone. Label it as "PAIN Management" Nursing: [phone] (Main) Dr. Andreia Gandolfi: [phone]   What is a "Virtual Visit"? It is an electronic healthcare encounter (medical visit) that takes place on real time (NOT TEXT or E-MAIL) over the telephone or computer device (desktop, laptop, tablet, smart phone, etc.). It allows for more communication flexibility between the patient and the healthcare provider.  Who decides when these types of visits will be used? The physician.  Who is eligible for these types of visits? Only those patients that can be reliably reached over the telephone.  What do you mean by reliably? We do not have time to call everyone multiple times, therefore those patients that tend to screen calls and then call back later are not suitable candidates for this system. We all hate telemarketers and "Robocalls".  We understand how people are reluctant to pickup on calls from "unknown numbers", therefore, we suggest you add our numbers to your list of contacts. This way, you should be able to identify our calls. All of our numbers are available above.   Who is not eligible? This option is not appropriate for medication management. Patients on controlled substances have to come in for "Face-to-Face" encounters. Monitoring of these substances is mandatory. Virtual visits do not allow for unannounced drug screening tests or pill counts. Not bringing your pills, or the empty bottles, may result in no refill.  When will this type of visits be used? For follow-up after procedures on established patients, as long as they have had the same procedure done before.  Whenever you are physically unable to attend a regular appointment.   Can I request my medication visit to be "Virtual"? Yes. Available on  a limited basis, only if you are unable to physically attend your appointment. However, you may only receive a 30-day prescription. Abuse of this option may result in discontinuation of medication due to inability to properly monitor the therapy.  When will I be called?  You will receive an initial call from [phone], from our nursing staff, one business day prior to your appointment. (For Monday appointments you will be called on Friday.) The purpose of this call is to review your medications and the results of any recent procedure.  If the nursing staff is unable to make contact, your virtual encounter may be canceled and rescheduled to a face-to-face visit.  Your provider will call you on the day of your appointment.  At what time will I be called? Providers will call whenever there is time available. Do not expect calls at any specific time. On the schedule, you will have an appointment time assigned to you however, this is seldom accurate. This is done simply to keep a list of patients to be called. Be advised that calls may come at anytime during the day. Calls start as early as 8:00 AM and go as late as 8:00 PM. This will depend on provider availability. The system is not perfect. If this is inconvenient for you, please request to be changed to an "in-person" appointment.   ____________________________________________________________________________________________    ____________________________________________________________________________________________  Post-Procedure Discharge Instructions  Instructions: Apply ice:  Purpose: This will minimize any swelling and discomfort after procedure.  When: Day of procedure, as soon as you get home. How: Fill a plastic sandwich bag with crushed ice. Cover it with a small towel and apply to injection   site. How long: (15 min on, 15 min off) Apply for 15 minutes then remove x 15 minutes.  Repeat sequence on day of procedure, until you go to  bed. Apply heat:  Purpose: To treat any soreness and discomfort from the procedure. When: Starting the next day after the procedure. How: Apply heat to procedure site starting the day following the procedure. How long: May continue to repeat daily, until discomfort goes away. Food intake: Start with clear liquids (like water) and advance to regular food, as tolerated.  Physical activities: Keep activities to a minimum for the first 8 hours after the procedure. After that, then as tolerated. Driving: If you have received any sedation, be responsible and do not drive. You are not allowed to drive for 24 hours after having sedation. Blood thinner: (Applies only to those taking blood thinners) You may restart your blood thinner 6 hours after your procedure. Insulin: (Applies only to Diabetic patients taking insulin) As soon as you can eat, you may resume your normal dosing schedule. Infection prevention: Keep procedure site clean and dry. Shower daily and clean area with soap and water. Post-procedure Pain Diary: Extremely important that this be done correctly and accurately. Recorded information will be used to determine the next step in treatment. For the purpose of accuracy, follow these rules: Evaluate only the area treated. Do not report or include pain from an untreated area. For the purpose of this evaluation, ignore all other areas of pain, except for the treated area. After your procedure, avoid taking a long nap and attempting to complete the pain diary after you wake up. Instead, set your alarm clock to go off every hour, on the hour, for the initial 8 hours after the procedure. Document the duration of the numbing medicine, and the relief you are getting from it. Do not go to sleep and attempt to complete it later. It will not be accurate. If you received sedation, it is likely that you were given a medication that may cause amnesia. Because of this, completing the diary at a later time may  cause the information to be inaccurate. This information is needed to plan your care. Follow-up appointment: Keep your post-procedure follow-up evaluation appointment after the procedure (usually 2 weeks for most procedures, 6 weeks for radiofrequencies). DO NOT FORGET to bring you pain diary with you.   Expect: (What should I expect to see with my procedure?) From numbing medicine (AKA: Local Anesthetics): Numbness or decrease in pain. You may also experience some weakness, which if present, could last for the duration of the local anesthetic. Onset: Full effect within 15 minutes of injected. Duration: It will depend on the type of local anesthetic used. On the average, 1 to 8 hours.  From steroids (Applies only if steroids were used): Decrease in swelling or inflammation. Once inflammation is improved, relief of the pain will follow. Onset of benefits: Depends on the amount of swelling present. The more swelling, the longer it will take for the benefits to be seen. In some cases, up to 10 days. Duration: Steroids will stay in the system x 2 weeks. Duration of benefits will depend on multiple posibilities including persistent irritating factors. Side-effects: If present, they may typically last 2 weeks (the duration of the steroids). Frequent: Cramps (if they occur, drink Gatorade and take over-the-counter Magnesium 450-500 mg once to twice a day); water retention with temporary weight gain; increases in blood sugar; decreased immune system response; increased appetite. Occasional: Facial flushing (red, warm   cheeks); mood swings; menstrual changes. Uncommon: Long-term decrease or suppression of natural hormones; bone thinning. (These are more common with higher doses or more frequent use. This is why we prefer that our patients avoid having any injection therapies in other practices.)  Very Rare: Severe mood changes; psychosis; aseptic necrosis. From procedure: Some discomfort is to be expected once  the numbing medicine wears off. This should be minimal if ice and heat are applied as instructed.  Call if: (When should I call?) You experience numbness and weakness that gets worse with time, as opposed to wearing off. New onset bowel or bladder incontinence. (Applies only to procedures done in the spine)  Emergency Numbers: Durning business hours (Monday - Thursday, 8:00 AM - 4:00 PM) (Friday, 9:00 AM - 12:00 Noon): [phone] After hours: [phone] NOTE: If you are having a problem and are unable connect with, or to talk to a provider, then go to your nearest urgent care or emergency department. If the problem is serious and urgent, please call 911. ____________________________________________________________________________________________    

## 2022-01-12 ENCOUNTER — Telehealth: Payer: Self-pay

## 2022-01-12 ENCOUNTER — Telehealth: Payer: Self-pay | Admitting: Pain Medicine

## 2022-01-12 NOTE — Telephone Encounter (Signed)
PT called back stated that he is doing well. PT wanted to say thanks to staff for caring for him. Thanks

## 2022-01-12 NOTE — Telephone Encounter (Signed)
Post procedure follow up.  LM 

## 2022-01-21 NOTE — Progress Notes (Unsigned)
Patient: Jonathan Stephens  Service Category: E/M  Provider: Gaspar Cola, MD  DOB: [birth year]  DOS: 01/25/2022  Location: Office  MRN: [id number]  Setting: Ambulatory outpatient  Referring Provider: Barbaraann Boys, MD  Type: Established Patient  Specialty: Interventional Pain Management  PCP: Barbaraann Boys, MD  Location: Remote location  Delivery: TeleHealth     Virtual Encounter - Pain Management PROVIDER NOTE: Information contained herein reflects review and annotations entered in association with encounter. Interpretation of such information and data should be left to medically-trained personnel. Information provided to patient can be located elsewhere in the medical record under "Patient Instructions". Document created using STT-dictation technology, any transcriptional errors that may result from process are unintentional.    Contact & Pharmacy Preferred: [phone] Home: [phone] (home) Mobile: [phone] (mobile) E-mail: [email or web address]  [id number] [place] [address] Phone: [phone] Fax: [phone]   Pre-screening  Jonathan Stephens offered "in-person" vs "virtual" encounter. He indicated preferring virtual for this encounter.   Reason COVID-19*  Social distancing based on CDC and AMA recommendations.   I contacted Jonathan Stephens on 01/25/2022 via telephone.      I clearly identified myself as 02/07/2022, MD. I verified that I was speaking with the correct person using two identifiers (Name: Jonathan Stephens, and date of birth: 1960-03-13).  Consent I sought verbal advanced consent from 64/04/1960 for virtual visit interactions. I informed Jonathan Stephens of possible security and privacy concerns, risks, and limitations associated with providing "not-in-person" medical evaluation and management services. I also informed Jonathan Stephens of the availability of "in-person" appointments. Finally, I informed him that  there would be a charge for the virtual visit and that he could be  personally, fully or partially, financially responsible for it. Jonathan Stephens expressed understanding and agreed to proceed.   Historic Elements   Jonathan Stephens is a 62 y.o. year old, male patient evaluated today after our last contact on 01/12/2022. Jonathan Stephens  has a past medical history of Hepatitis. 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 Jonathan Stephens has a current medication list which includes the following prescription(s): amlodipine, atorvastatin, duloxetine, epinephrine, ferrous sulfate, fluticasone, gabapentin, ibuprofen, multivitamin with minerals, naloxone, fish oil, oxycodone-acetaminophen, potassium chloride er, sertraline, trazodone, vitamin e, and trazodone. He  reports that he has been smoking e-cigarettes. He has never used smokeless tobacco. He reports that he does not currently use alcohol. He reports that he does not use drugs. Jonathan Stephens is allergic to bee venom.  Estimated body mass index is 27.6 kg/m as calculated from the following:   Height as of 01/11/22: 6\' 2"  (1.88 m).   Weight as of 01/11/22: 215 lb (97.5 kg).  HPI  Today, he is being contacted for a post-procedure assessment.  Post-procedure evaluation   Type: Lumbar Facet, Medial Branch Block(s) #2  Laterality: Bilateral  Level: L4, L5, and S1 Medial Branch Level(s). Injecting these levels blocks the L5-S1 lumbar facet joints. Imaging: Fluoroscopic guidance         Anesthesia: Local anesthesia (1-2% Lidocaine) Anxiolysis: None Sedation: No Sedation                       DOS: 01/11/2022 Performed by: 14/11/2022, MD  Primary  Purpose: Diagnostic/Therapeutic Indications: Low back pain severe enough to impact quality of life or function. 1. Lumbar  facet joint pain   2. Spondylosis without myelopathy or radiculopathy, lumbosacral region   3. Grade 1 Retrolisthesis of L3/L4   4. Grade 1 Anterolisthesis of lumbar spine (L4/L5)   5. Lumbosacral facet joint syndrome (Bilateral)   6. DDD (degenerative disc disease), lumbar   7. Chronic low back pain (1ry area of Pain) (Bilateral) (R>L) w/o sciatica   8. Fixation hardware in spine    NAS-11 Pain score:   Pre-procedure: 7 /10   Post-procedure: 2/10 (Right) 0/10 (Left)  Note: Today prior to the procedure we asked the patient to tell us where his worst pain was located in terms of his lower back.  We drew a line as to where the most cephalad portion of the pain was located and then I limited the block to the facet joints in that area.  When I used fluoroscopy to identify the area, it was clear that the primary area seem to be that of the L5-S1 facet joint.  Since his last pedicle screw is at L5, this would suggest that we are dealing with "adjacent level disease".  Today I have concentrated on blocking just the L5-S1 facet joint.         Effectiveness:  Initial hour after procedure: 100 %. Subsequent 4-6 hours post-procedure: 100 %. Analgesia past initial 6 hours: 50 % (lasting 3 days). Ongoing improvement:  Analgesic:  *** Function:    ***    ROM:    ***     Pharmacotherapy Assessment   Opioid Analgesic: No chronic opioid analgesics therapy prescribed by our practice. Oxycodone/APAP 5/325, 1 tab p.o. twice daily (# 60) (last filled on 12/01/2021) (prescription written by Daniel Nones, MD)  MME/day: 15 mg/day   Monitoring: [place] PMP: PDMP reviewed during this encounter.       Pharmacotherapy: No side-effects or adverse reactions reported. Compliance: No problems identified. Effectiveness: Clinically acceptable. Plan: Refer to "POC". UDS:  Summary  Date Value Ref Range Status  09/11/2021 Note  Final    Comment:     ==================================================================== Compliance Drug Analysis, Ur ==================================================================== Test                             Result       Flag       Units  Drug Present and Declared for Prescription Verification   Noroxycodone                   869          EXPECTED   ng/mg creat   Noroxymorphone                 39           EXPECTED   ng/mg creat    Noroxycodone and noroxymorphone are expected metabolites of    oxycodone. Noroxymorphone is an expected metabolite of oxymorphone.    Sources of oxycodone and/or oxymorphone are scheduled prescription    medications.    Gabapentin                     PRESENT      EXPECTED   Mirtazapine                    PRESENT      EXPECTED   Acetaminophen  PRESENT      EXPECTED  Drug Present not Declared for Prescription Verification   Carboxy-THC                    15           UNEXPECTED ng/mg creat    Carboxy-THC is a metabolite of tetrahydrocannabinol (THC). Source of    THC is most commonly herbal marijuana or marijuana-based products,    but THC is also present in a scheduled prescription medication.    Trace amounts of THC can be present in hemp and cannabidiol (CBD)    products. This test is not intended to distinguish between delta-9-    tetrahydrocannabinol, the predominant form of THC in most herbal or    marijuana-based products, and delta-8-tetrahydrocannabinol.    Duloxetine                     PRESENT      UNEXPECTED   Trazodone                      PRESENT      UNEXPECTED   1,3 chlorophenyl piperazine    PRESENT      UNEXPECTED    1,3-chlorophenyl piperazine is an expected metabolite of trazodone.    Ibuprofen                      PRESENT      UNEXPECTED  Drug Absent but Declared for Prescription Verification   Oxycodone                      Not Detected UNEXPECTED ng/mg creat    Oxycodone is almost always present in patients taking this  drug    consistently.  Absence of oxycodone could be due to lapse of time    since the last dose or unusual pharmacokinetics (rapid metabolism).  ==================================================================== Test                      Result    Flag   Units      Ref Range   Creatinine              164              mg/dL      >=09 ==================================================================== Declared Medications:  The flagging and interpretation on this report are based on the  following declared medications.  Unexpected results may arise from  inaccuracies in the declared medications.   **Note: The testing scope of this panel includes these medications:   Gabapentin (Neurontin)  Mirtazapine (Remeron)  Oxycodone (Percocet)   **Note: The testing scope of this panel does not include small to  moderate amounts of these reported medications:   Acetaminophen (Percocet)   **Note: The testing scope of this panel does not include the  following reported medications:   Amlodipine (Norvasc)  Atorvastatin (Lipitor)  Fish Oil  Iron  Multivitamin  Simvastatin (Zocor)  Vitamin E ==================================================================== For clinical consultation, please call [phone]. ====================================================================    No results found for: "CBDTHCR", "D8THCCBX", "D9THCCBX"   Laboratory Chemistry Profile   Renal Lab Results  Component Value Date   BUN 7 (L) 09/11/2021   CREATININE 0.89 09/11/2021   BCR 8 (L) 09/11/2021   GFRAA >60 09/09/2019   GFRNONAA >60 12/01/2019    Hepatic Lab Results  Component Value Date   AST 15 09/11/2021  ALT 19 12/01/2019   ALBUMIN 4.6 09/11/2021   ALKPHOS 130 (H) 09/11/2021    Electrolytes Lab Results  Component Value Date   NA 143 09/11/2021   K 2.9 (L) 09/11/2021   CL 97 09/11/2021   CALCIUM 9.6 09/11/2021   MG 1.9 09/11/2021    Bone Lab Results  Component Value Date    25OHVITD1 59 09/11/2021   25OHVITD2 <1.0 09/11/2021   25OHVITD3 59 09/11/2021    Inflammation (CRP: Acute Phase) (ESR: Chronic Phase) Lab Results  Component Value Date   CRP 9 09/11/2021   ESRSEDRATE 32 (H) 09/11/2021         Note: Above Lab results reviewed.  Imaging  DG PAIN CLINIC C-ARM 1-60 MIN NO REPORT Fluoro was used, but no Radiologist interpretation will be provided.  Please refer to "NOTES" tab for provider progress note.  Assessment  There were no encounter diagnoses.  Plan of Care  Problem-specific:  No problem-specific Assessment & Plan notes found for this encounter.  Jonathan Stephens has a current medication list which includes the following long-term medication(s): amlodipine, atorvastatin, duloxetine, ferrous sulfate, fluticasone, gabapentin, potassium chloride er, sertraline, trazodone, and trazodone.  Pharmacotherapy (Medications Ordered): No orders of the defined types were placed in this encounter.  Orders:  No orders of the defined types were placed in this encounter.  Follow-up plan:   No follow-ups on file.     Interventional Therapies  Risk Factors  Considerations:       Extensive Lumbar HARDWARE   Planned  Pending:      Under consideration:   Diagnostic/therapeutic right greater trochanteric bursa injection #1  Diagnostic bilateral lumbar facet MBB #2    Completed:   Diagnostic/therapeutic right greater trochanteric bursa inj. x1 (12/12/2021) (100/100/100/100)  Diagnostic bilateral lumbar facet MBB (L2-S1) x1 (12/12/2021) (8 to 0/10) (100/100/100 x 3 days/LBP:0 LEP:100)  Diagnostic bilateral lumbar facet MBB (L4-S1) #2 (01/11/2022) (7 to R:2/10 L:0/10)    Completed by other providers:   Diagnostic/therapeutic left L2-3 TFESI x1 (03/04/2017) by Etheleen Mayhew, MD ([place])  [place] left IA hip injection x2 (04/12/2017, 05/08/2017) by Etheleen Mayhew, MD ([place])   [place] left L4-5 and L5-S1 TFESI x3 (05/29/2017, 09/20/2017, 12/12/2018) by Etheleen Mayhew, MD ([place])  Diagnostic/therapeutic right L4-5 and L5-S1 TESI x1 (06/26/2019)  by Etheleen Mayhew, MD ([place])  Therapeutic left total hip replacement (THR) x1 (09/30/2018) by P.  Dalldorf, MD  Therapeutic left L2-3, L3-4, L4-5 anterior lateral lumbar interbody fusion w/ percutaneous pedicle screw fixation (left) (12/03/2019) by Dr. Erline Levine     Therapeutic  Palliative (PRN) options:   None established         Recent Visits Date Type Provider Dept  01/11/22 Procedure visit Milinda Pointer, MD [facility]  12/21/21 Office Visit Milinda Pointer, MD [facility]  12/12/21 Procedure visit Milinda Pointer, MD [facility]  12/06/21 Office Visit Milinda Pointer, MD [facility]  Showing recent visits within past 90 days and meeting all other requirements Future Appointments No visits were found meeting these conditions. Showing future appointments within next 90 days and meeting all other requirements  I discussed the assessment and treatment plan with the patient. The patient was provided an opportunity to ask questions and all were answered. The patient agreed with the plan and demonstrated an understanding of the instructions.  Patient advised to call back or seek an in-person evaluation if the symptoms or  condition worsens.  Duration of encounter: *** minutes.  Note by: Oswaldo Done, MD Date: 01/25/2022; Time: 3:33 PM

## 2022-01-25 ENCOUNTER — Ambulatory Visit: Payer: Federal, State, Local not specified - PPO | Attending: Pain Medicine | Admitting: Pain Medicine

## 2022-01-25 DIAGNOSIS — M47817 Spondylosis without myelopathy or radiculopathy, lumbosacral region: Secondary | ICD-10-CM

## 2022-01-25 DIAGNOSIS — M4316 Spondylolisthesis, lumbar region: Secondary | ICD-10-CM | POA: Diagnosis not present

## 2022-01-25 DIAGNOSIS — M4310 Spondylolisthesis, site unspecified: Secondary | ICD-10-CM | POA: Diagnosis not present

## 2022-01-25 DIAGNOSIS — M5386 Other specified dorsopathies, lumbar region: Secondary | ICD-10-CM

## 2022-01-25 DIAGNOSIS — M545 Low back pain, unspecified: Secondary | ICD-10-CM

## 2022-01-25 DIAGNOSIS — M5459 Other low back pain: Secondary | ICD-10-CM | POA: Diagnosis not present

## 2022-01-25 DIAGNOSIS — G8929 Other chronic pain: Secondary | ICD-10-CM

## 2022-01-25 DIAGNOSIS — M47816 Spondylosis without myelopathy or radiculopathy, lumbar region: Secondary | ICD-10-CM

## 2022-01-25 NOTE — Patient Instructions (Signed)

## 2022-02-04 NOTE — Progress Notes (Unsigned)
PROVIDER NOTE: Interpretation of information contained herein should be left to medically-trained personnel. Specific patient instructions are provided elsewhere under "Patient Instructions" section of medical record. This document was created in part using STT-dictation technology, any transcriptional errors that may result from this process are unintentional.  Patient: Jonathan Stephens Type: Established DOB: 10/02/1960 MRN: [id number] PCP: Orene DesanctisBehling, Karen, MD   Service: Procedure DOS: 02/08/2022 Setting: Ambulatory Location: Ambulatory outpatient facility Delivery: Face-to-face Provider: Oswaldo DoneFrancisco A Genevieve Ritzel, MD Specialty: Interventional Pain Management Specialty designation: 09 Location: Outpatient facility Ref. Prov.: Delano MetzNaveira, Damien Cisar, MD   Interventional Therapy     Type: Lumbar Facet, Medial Branch Radiofrequency Ablation (RFA) #1  Laterality: Right (-RT)  Level: L4, L5, and S1 Medial Branch Level(s). These levels will denervate the L5-S1 lumbar facet joints.  Imaging: Fluoroscopy-guided         Anesthesia: Local anesthesia (1-2% Lidocaine) Anxiolysis: IV Versed         Sedation: Moderate Sedation                       DOS: 02/08/2022  Performed by: Oswaldo DoneFrancisco A Jacqueline Delapena, MD  Purpose: Therapeutic/Palliative Indications: Low back pain severe enough to impact quality of life or function. Indications: 1. Lumbosacral facet joint syndrome (Bilateral)   2. Spondylosis without myelopathy or radiculopathy, lumbosacral region   3. Chronic low back pain (1ry area of Pain) (Bilateral) (R>L) w/o sciatica   4. Lumbar facet joint pain   5. Lumbar facet arthropathy (Multilevel) (Bilateral)   6. Grade 1 Retrolisthesis of L3/L4   7. 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 Osteoarthritis of facet joint of lumbar spine   9. DDD (degenerative disc disease), lumbar   10. 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 Over-the-counter medications 2. Anti-inflammatory medications 3. Muscle relaxants 4. Membrane stabilizers 5. Opioids 6. Physical therapy and/or chiropractic manipulation 7. Modalities (Heat, ice, etc.) 8. Invasive techniques such as nerve blocks. Jonathan Stephens has attained more than 50% relief of the pain from a series of diagnostic injections conducted in separate occasions.  Pain Score: Pre-procedure: 3 /10 Post-procedure: 3 /10     Position / Prep / Materials:  Position: Prone  Prep solution: DuraPrep (Iodine Povacrylex [0.7% available iodine] and Isopropyl Alcohol, 74% w/w) Prep Area: Entire Lumbosacral Region (Lower back from mid-thoracic region to end of tailbone and from flank to flank.) Materials:  Tray: RFA (Radiofrequency) tray Needle(s):  Type: RFA (Teflon-coated radiofrequency ablation needles) Gauge (G): 22  Length: Regular (10cm) Qty: 4      Post-procedure evaluation   Type: Lumbar Facet, Medial Branch Block(s) #2  Laterality: Bilateral  Level: L4, L5, and S1 Medial Branch Level(s). Injecting these levels blocks the L5-S1 lumbar facet joints. Imaging: Fluoroscopic guidance         Anesthesia: Local anesthesia (1-2% Lidocaine) Anxiolysis: None Sedation: No Sedation                       DOS: 01/11/2022 Performed by: Oswaldo DoneFrancisco A Hannie Shoe, MD  Primary Purpose: Diagnostic/Therapeutic Indications: Low back pain severe enough to impact quality of life or function. 1. Lumbar facet joint pain   2.  Spondylosis without myelopathy or radiculopathy, lumbosacral region   3. Grade 1 Retrolisthesis of L3/L4   4. Grade 1 Anterolisthesis of lumbar spine (L4/L5)   5. Lumbosacral facet joint syndrome (Bilateral)   6. DDD (degenerative disc disease),  lumbar   7. Chronic low back pain (1ry area of Pain) (Bilateral) (R>L) w/o sciatica   8. Fixation hardware in spine    NAS-11 Pain score:   Pre-procedure: 7 /10   Post-procedure: 2/10 (Right) 0/10 (Left)  Note: Today prior to the procedure we asked the patient to tell us where his worst pain was located in terms of his lower back.  We drew a line as to where the most cephalad portion of the pain was located and then I limited the block to the facet joints in that area.  When I used fluoroscopy to identify the area, it was clear that the primary area seem to be that of the L5-S1 facet joint.  Since his last pedicle screw is at L5, this would suggest that we are dealing with "adjacent level disease".  Today I have concentrated on blocking just the L5-S1 facet joint.          Effectiveness:  Initial hour after procedure: 100 %. Subsequent 4-6 hours post-procedure: 100 %. Analgesia past initial 6 hours: 50 %. Ongoing improvement:  Analgesic: The indicates having attained 100% relief of the pain for the duration of the local anesthetic followed by an ongoing 50% improvement.  He is looking forward to having the radiofrequency. Function: Somewhat improved ROM: Somewhat improved  Pre-op H&P Assessment:  Jonathan Stephens is a 62 y.o. (year old), male patient, seen today for interventional treatment. 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 Jonathan Stephens has a current medication list which includes the following prescription(s): amlodipine, atorvastatin, duloxetine, epinephrine, ferrous sulfate, fluticasone, gabapentin, hydrocodone-acetaminophen, [START ON 02/15/2022] hydrocodone-acetaminophen, ibuprofen, multivitamin with minerals, naloxone, fish oil, oxycodone-acetaminophen, potassium chloride er, sertraline, trazodone,  vitamin e, and trazodone, and the following Facility-Administered Medications: triamcinolone acetonide. His primarily concern today is the Back Pain  Initial Vital Signs:  Pulse/HCG Rate: 92  Temp: (!) 97.4 F (36.3 C) Resp: 16 BP: 104/75 SpO2: 99 %  BMI: Estimated body mass index is 28.25 kg/m as calculated from the following:   Height as of this encounter: 6\' 2"  (1.88 m).   Weight as of this encounter: 220 lb (99.8 kg).  Risk Assessment: Allergies: Reviewed. He is allergic to bee venom.  Allergy Precautions: None required Coagulopathies: Reviewed. None identified.  Blood-thinner therapy: None at this time Active Infection(s): Reviewed. None identified. Jonathan Stephens is afebrile  Site Confirmation: Jonathan Stephens was asked to confirm the procedure and laterality before marking the site Procedure checklist: Completed Consent: Before the procedure and under the influence of no sedative(s), amnesic(s), or anxiolytics, the patient was informed of the treatment options, risks and possible complications. To fulfill our ethical and legal obligations, as recommended by the American Medical Association's Code of Ethics, I have informed the patient of my clinical impression; the nature and purpose of the treatment or procedure; the risks, benefits, and possible complications of the intervention; the alternatives, including doing nothing; the risk(s) and benefit(s) of the alternative treatment(s) or procedure(s); and the risk(s) and benefit(s) of doing nothing. The patient was provided information about the general risks and possible complications associated with the procedure.  These may include, but are not limited to: failure to achieve desired goals, infection, bleeding, organ or nerve damage, allergic reactions, paralysis, and death. In addition, the patient was informed of those risks and complications associated to Spine-related procedures, such as failure to decrease pain; infection (i.e.: Meningitis,  epidural or intraspinal abscess); bleeding (i.e.: epidural hematoma, subarachnoid hemorrhage, or any other type of intraspinal or peri-dural bleeding); organ or nerve damage (i.e.: Any type of peripheral nerve, nerve root, or spinal cord injury) with subsequent damage to sensory, motor, and/or autonomic systems, resulting in permanent pain, numbness, and/or weakness of one or several areas of the body; allergic reactions; (i.e.: anaphylactic reaction); and/or death. Furthermore, the patient was informed of those risks and complications associated with the medications. These include, but are not limited to: allergic reactions (i.e.: anaphylactic or anaphylactoid reaction(s)); adrenal axis suppression; blood sugar elevation that in diabetics may result in ketoacidosis or comma; water retention that in patients with history of congestive heart failure may result in shortness of breath, pulmonary edema, and decompensation with resultant heart failure; weight gain; swelling or edema; medication-induced neural toxicity; particulate matter embolism and blood vessel occlusion with resultant organ, and/or nervous system infarction; and/or aseptic necrosis of one or more joints. Finally, the patient was informed that Medicine is not an exact science; therefore, there is also the possibility of unforeseen or unpredictable risks and/or possible complications that may result in a catastrophic outcome. The patient indicated having understood very clearly. We have given the patient no guarantees and we have made no promises. Enough time was given to the patient to ask questions, all of which were answered to the patient's satisfaction. Mr. Ream has indicated that he wanted to continue with the procedure. Attestation: I, the ordering provider, attest that I have discussed with the patient the benefits, risks, side-effects, alternatives, likelihood of achieving goals, and potential problems during recovery for the procedure that  I have provided informed consent. Date  Time: 02/08/2022  9:31 AM   Pre-Procedure Preparation:  Monitoring: As per clinic protocol. Respiration, ETCO2, SpO2, BP, heart rate and rhythm monitor placed and checked for adequate function Safety Precautions: Patient was assessed for positional comfort and pressure points before starting the procedure. Time-out: I initiated and conducted the "Time-out" before starting the procedure, as per protocol. The patient was asked to participate by confirming the accuracy of the "Time Out" information. Verification of the correct person, site, and procedure were performed and confirmed by me, the nursing staff, and the patient. "Time-out" conducted as per Joint Commission's Universal Protocol (UP.01.01.01). Time: 1111  Description of Procedure:          Laterality: Right Levels:  L2, L3, L4, L5, and S1 Medial Branch Level(s). Safety Precautions: Aspiration looking for blood return was conducted prior to all injections. At no point did we inject any substances, as a needle was being advanced. Before injecting, the patient was told to immediately notify me if he was experiencing any new onset of "ringing in the ears, or metallic taste in the mouth". No attempts were made at seeking any paresthesias. Safe injection practices and needle disposal techniques used. Medications properly checked for expiration dates. SDV (single dose vial) medications used. After the completion of the procedure, all disposable equipment used was discarded in the proper designated medical waste containers. Local Anesthesia: Protocol guidelines were followed. The patient was positioned over the fluoroscopy table. The area was prepped in the usual manner. The time-out was completed. The target area was identified  using fluoroscopy. A 12-in long, straight, sterile hemostat was used with fluoroscopic guidance to locate the targets for each level blocked. Once located, the skin was marked with an  approved surgical skin marker. Once all sites were marked, the skin (epidermis, dermis, and hypodermis), as well as deeper tissues (fat, connective tissue and muscle) were infiltrated with a small amount of a short-acting local anesthetic, loaded on a 10cc syringe with a 25G, 1.5-in  Needle. An appropriate amount of time was allowed for local anesthetics to take effect before proceeding to the next step. Technical description of process:  Radiofrequency Ablation (RFA) L2 Medial Branch Nerve RFA: The target area for the L2 medial branch is at the junction of the postero-lateral aspect of the superior articular process and the superior, posterior, and medial edge of the transverse process of L3. Under fluoroscopic guidance, a Radiofrequency needle was inserted until contact was made with os over the superior postero-lateral aspect of the pedicular shadow (target area). Sensory and motor testing was conducted to properly adjust the position of the needle. Once satisfactory placement of the needle was achieved, the numbing solution was slowly injected after negative aspiration for blood. 2.0 mL of the nerve block solution was injected without difficulty or complication. After waiting for at least 3 minutes, the ablation was performed. Once completed, the needle was removed intact. L3 Medial Branch Nerve RFA: The target area for the L3 medial branch is at the junction of the postero-lateral aspect of the superior articular process and the superior, posterior, and medial edge of the transverse process of L4. Under fluoroscopic guidance, a Radiofrequency needle was inserted until contact was made with os over the superior postero-lateral aspect of the pedicular shadow (target area). Sensory and motor testing was conducted to properly adjust the position of the needle. Once satisfactory placement of the needle was achieved, the numbing solution was slowly injected after negative aspiration for blood. 2.0 mL of the nerve  block solution was injected without difficulty or complication. After waiting for at least 3 minutes, the ablation was performed. Once completed, the needle was removed intact. L4 Medial Branch Nerve RFA: The target area for the L4 medial branch is at the junction of the postero-lateral aspect of the superior articular process and the superior, posterior, and medial edge of the transverse process of L5. Under fluoroscopic guidance, a Radiofrequency needle was inserted until contact was made with os over the superior postero-lateral aspect of the pedicular shadow (target area). Sensory and motor testing was conducted to properly adjust the position of the needle. Once satisfactory placement of the needle was achieved, the numbing solution was slowly injected after negative aspiration for blood. 2.0 mL of the nerve block solution was injected without difficulty or complication. After waiting for at least 3 minutes, the ablation was performed. Once completed, the needle was removed intact. L5 Medial Branch Nerve RFA: The target area for the L5 medial branch is at the junction of the postero-lateral aspect of the superior articular process of S1 and the superior, posterior, and medial edge of the sacral ala. Under fluoroscopic guidance, a Radiofrequency needle was inserted until contact was made with os over the superior postero-lateral aspect of the pedicular shadow (target area). Sensory and motor testing was conducted to properly adjust the position of the needle. Once satisfactory placement of the needle was achieved, the numbing solution was slowly injected after negative aspiration for blood. 2.0 mL of the nerve block solution was injected without difficulty or complication. After  waiting for at least 3 minutes, the ablation was performed. Once completed, the needle was removed intact. S1 Medial Branch Nerve RFA: The target area for the S1 medial branch is located inferior to the junction of the S1 superior  articular process and the L5 inferior articular process, posterior, inferior, and lateral to the 6 o'clock position of the L5-S1 facet joint, just superior to the S1 posterior foramen. Under fluoroscopic guidance, the Radiofrequency needle was advanced until contact was made with os over the Target area. Sensory and motor testing was conducted to properly adjust the position of the needle. Once satisfactory placement of the needle was achieved, the numbing solution was slowly injected after negative aspiration for blood. 2.0 mL of the nerve block solution was injected without difficulty or complication. After waiting for at least 3 minutes, the ablation was performed. Once completed, the needle was removed intact. Radiofrequency lesioning (ablation):  Radiofrequency Generator: Medtronic AccurianTM AG 1000 RF Generator Sensory Stimulation Parameters: 50 Hz was used to locate & identify the nerve, making sure that the needle was positioned such that there was no sensory stimulation below 0.3 V or above 0.7 V. Motor Stimulation Parameters: 2 Hz was used to evaluate the motor component. Care was taken not to lesion any nerves that demonstrated motor stimulation of the lower extremities at an output of less than 2.5 times that of the sensory threshold, or a maximum of 2.0 V. Lesioning Technique Parameters: Standard Radiofrequency settings. (Not bipolar or pulsed.) Temperature Settings: 80 degrees C Lesioning time: 60 seconds Intra-operative Compliance: Compliant  Once the entire procedure was completed, the treated area was cleaned, making sure to leave some of the prepping solution back to take advantage of its long term bactericidal properties.    Illustration of the posterior view of the lumbar spine and the posterior neural structures. Laminae of L2 through S1 are labeled. DPRL5, dorsal primary ramus of L5; DPRS1, dorsal primary ramus of S1; DPR3, dorsal primary ramus of L3; FJ, facet (zygapophyseal)  joint L3-L4; I, inferior articular process of L4; LB1, lateral branch of dorsal primary ramus of L1; IAB, inferior articular branches from L3 medial branch (supplies L4-L5 facet joint); IBP, intermediate branch plexus; MB3, medial branch of dorsal primary ramus of L3; NR3, third lumbar nerve root; S, superior articular process of L5; SAB, superior articular branches from L4 (supplies L4-5 facet joint also); TP3, transverse process of L3.  Vitals:   02/08/22 1109 02/08/22 1119 02/08/22 1128 02/08/22 1135  BP: 127/80 111/69 107/72 110/70  Pulse: 79 79 80 78  Resp: 16 12 14 16   Temp:      SpO2: 100% 99% 100% 99%  Weight:      Height:        Start Time: 1111 hrs. End Time: 1132 hrs.  Imaging Guidance (Spinal):          Type of Imaging Technique: Fluoroscopy Guidance (Spinal) Indication(s): Assistance in needle guidance and placement for procedures requiring needle placement in or near specific anatomical locations not easily accessible without such assistance. Exposure Time: Please see nurses notes. Contrast: None used. Fluoroscopic Guidance: I was personally present during the use of fluoroscopy. "Tunnel Vision Technique" used to obtain the best possible view of the target area. Parallax error corrected before commencing the procedure. "Direction-depth-direction" technique used to introduce the needle under continuous pulsed fluoroscopy. Once target was reached, antero-posterior, oblique, and lateral fluoroscopic projection used confirm needle placement in all planes. Images permanently stored in EMR. Interpretation: No contrast injected.  I personally interpreted the imaging intraoperatively. Adequate needle placement confirmed in multiple planes. Permanent images saved into the patient's record.  Antibiotic Prophylaxis:   Anti-infectives (From admission, onward)    None      Indication(s): None identified  Post-operative Assessment:  Post-procedure Vital Signs:  Pulse/HCG Rate: 78   Temp: (!) 97.4 F (36.3 C) Resp: 16 BP: 110/70 SpO2: 99 %  EBL: None  Complications: No immediate post-treatment complications observed by team, or reported by patient.  Note: The patient tolerated the entire procedure well. A repeat set of vitals were taken after the procedure and the patient was kept under observation following institutional policy, for this type of procedure. Post-procedural neurological assessment was performed, showing return to baseline, prior to discharge. The patient was provided with post-procedure discharge instructions, including a section on how to identify potential problems. Should any problems arise concerning this procedure, the patient was given instructions to immediately contact us, at any time, without hesitation. In any case, we plan to contact the patient by telephone for a follow-up status report regarding this interventional procedure.  Comments:  No additional relevant information.  Plan of Care  Orders:  Orders Placed This Encounter  Procedures   Radiofrequency,Lumbar    Scheduling Instructions:     Side(s): Right-sided     Level: L5-S1 Facets (L4, L5, and S1 Medial Branch)     Sedation: With Sedation.     Timeframe: Today    Order Specific Question:   Where will this procedure be performed?    Answer:   [facility]   DG [facility]IN [facility] C-ARM 1-60 MIN NO REPORT    Intraoperative interpretation by procedural physician at [facility].    Standing Status:   Standing    Number of Occurrences:   1    Order Specific Question:   Reason for exam:    Answer:   Assistance in needle guidance and placement for procedures requiring needle placement in or near specific anatomical locations not easily accessible without such assistance.   Informed Consent Details: Physician/Practitioner Attestation; Transcribe to consent form and obtain patient signature    Nursing Order: Transcribe to consent form and obtain patient signature. Note:  Always confirm laterality of pain with Mr. Bertino, before procedure.    Order Specific Question:   Physician/Practitioner attestation of informed consent for procedure/surgical case    Answer:   I, the physician/practitioner, attest that I have discussed with the patient the benefits, risks, side effects, alternatives, likelihood of achieving goals and potential problems during recovery for the procedure that I have provided informed consent.    Order Specific Question:   Procedure    Answer:   Lumbar Facet Radiofrequency Ablation    Order Specific Question:   Physician/Practitioner performing the procedure    Answer:   Pablo Stauffer A. Laban Emperor, MD    Order Specific Question:   Indication/Reason    Answer:   Low Back Pain, with our without leg pain, due to Facet Joint Arthralgia (Joint Pain) known as Lumbar Facet Syndrome, secondary to Lumbar, and/or Lumbosacral Spondylosis (Arthritis of the Spine), without myelopathy or radiculopathy (Nerve Damage).   Provide equipment / supplies at bedside    Procedure tray: "Radiofrequency Tray" Additional material: Large hemostat (x1); Small hemostat (x1); Towels (x8); 4x4 sterile sponge pack (x1) Needle type: Teflon-coated Radiofrequency Needle (Disposable  single use) Size: Regular Quantity: 4    Standing Status:   Standing    Number of Occurrences:   1  Order Specific Question:   Specify    Answer:   Radiofrequency Tray   Nursing Instructions:    Please complete this patient's postprocedure evaluation.    Scheduling Instructions:     Please complete this patient's postprocedure evaluation.   Chronic Opioid Analgesic:  No chronic opioid analgesics therapy prescribed by our practice. Oxycodone/APAP 5/325, 1 tab p.o. twice daily (# 60) (last filled on 12/01/2021) (prescription written by Daniel Nones, MD)  MME/day: 15 mg/day   Medications ordered for procedure: Meds ordered this encounter  Medications   lidocaine (XYLOCAINE) 2 % (with pres)  injection 400 mg   pentafluoroprop-tetrafluoroeth (GEBAUERS) aerosol   DISCONTD: lactated ringers infusion   DISCONTD: midazolam (VERSED) 5 MG/5ML injection 0.5-2 mg    Make sure Flumazenil is available in the pyxis when using this medication. If oversedation occurs, administer 0.2 mg IV over 15 sec. If after 45 sec no response, administer 0.2 mg again over 1 min; may repeat at 1 min intervals; not to exceed 4 doses (1 mg)   DISCONTD: fentaNYL (SUBLIMAZE) injection 25-50 mcg    Make sure Narcan is available in the pyxis when using this medication. In the event of respiratory depression (RR< 8/min): Titrate NARCAN (naloxone) in increments of 0.1 to 0.2 mg IV at 2-3 minute intervals, until desired degree of reversal.   ropivacaine (PF) 2 mg/mL (0.2%) (NAROPIN) injection 9 mL   triamcinolone acetonide (KENALOG-40) injection 40 mg   HYDROcodone-acetaminophen (NORCO/VICODIN) 5-325 MG tablet    Sig: Take 1 tablet by mouth every 6 (six) hours as needed for up to 7 days for severe pain. Must last 7 days.    Dispense:  28 tablet    Refill:  0    For acute post-operative pain. Not to be refilled. Must last 7 days.   HYDROcodone-acetaminophen (NORCO/VICODIN) 5-325 MG tablet    Sig: Take 1 tablet by mouth every 6 (six) hours as needed for up to 7 days for severe pain. Must last 7 days.    Dispense:  28 tablet    Refill:  0    For acute post-operative pain. Not to be refilled.  Must last 7 days.   Medications administered: We administered lidocaine, pentafluoroprop-tetrafluoroeth, and ropivacaine (PF) 2 mg/mL (0.2%).  See the medical record for exact dosing, route, and time of administration.  Follow-up plan:   Return in about 2 weeks (around 02/22/2022) for ( ), (ECT): (L) L-FCT (L5-S1) RFA #1.       Interventional Therapies  Risk Factors  Considerations:       Extensive Lumbar HARDWARE   Planned  Pending:   Therapeutic right (L5-S1) lumbar facet RFA #1    Under consideration:    Diagnostic/therapeutic right greater trochanteric bursa injection #1  Therapeutic left (L5-S1) lumbar facet RFA #1    Completed:   Diagnostic/therapeutic right greater trochanteric bursa inj. x1 (12/12/2021) (100/100/100/100)  Diagnostic bilateral lumbar facet MBB (L2-S1) x1 (12/12/2021) (8 to 0/10) (100/100/100 x 3 days/LBP:0 LEP:100)  Diagnostic bilateral lumbar facet MBB (L4-S1) x2 (01/11/2022) (7 to R:2/10 L:0/10) (100/100/50/0)    Completed by other providers:   Diagnostic/therapeutic left L2-3 TFESI x1 (03/04/2017) by Holland Commons, MD ([facility])  Diagnostic/therapeutic left IA hip injection x2 (04/12/2017, 05/08/2017) by Holland Commons, MD ([facility])  Diagnostic/therapeutic left L4-5 and L5-S1 TFESI x3 (05/29/2017, 09/20/2017, 12/12/2018) by Holland Commons, MD ([facility])  Diagnostic/therapeutic right L4-5 and L5-S1 TESI x1 (06/26/2019)  by Holland Commons, MD ([facility])  Therapeutic left total hip replacement (THR) x1 (09/30/2018) by P.  Dalldorf, MD  Therapeutic left L2-3, L3-4, L4-5 anterior lateral lumbar interbody fusion w/ percutaneous pedicle screw fixation (left) (12/03/2019) by Dr. Erline Levine     Therapeutic  Palliative (PRN) options:   None established      Recent Visits Date Type Provider Dept  01/11/22 Procedure visit Milinda Pointer, MD [facility]  12/21/21 Office Visit Milinda Pointer, MD [facility]  12/12/21 Procedure visit Milinda Pointer, MD [facility]  12/06/21 Office Visit Milinda Pointer, MD [facility]  Showing recent visits within past 90 days and meeting all other requirements Today's Visits Date Type Provider Dept  02/08/22 Procedure visit Milinda Pointer, MD [facility]  Showing today's visits and meeting all other requirements Future Appointments Date Type Provider Dept  02/20/22 Appointment Milinda Pointer, MD [facility]  Showing future appointments within next 90 days and meeting all other requirements  Disposition: Discharge home  Discharge (Date  Time): 02/08/2022; 1136 hrs.   Primary Care Physician: Barbaraann Boys, MD Location: [facility] Note by: Gaspar Cola, MD Date: 02/08/2022; Time: 12:34 PM  Disclaimer:  Medicine is not an Chief Strategy Officer. The only guarantee in medicine is that nothing is guaranteed. It is important to note that the decision to proceed with this intervention was based on the information collected from the patient. The Data and conclusions were drawn from the patient's questionnaire, the interview, and the physical examination. Because the information was provided in large part by the patient, it cannot be guaranteed that it has not been purposely or unconsciously manipulated. Every effort has been made to obtain as much relevant data as possible for this evaluation. It is important to note that the conclusions that lead to this procedure are derived in large part from the available data. Always take into account that the treatment will also be dependent on availability of resources and existing treatment guidelines, considered by other Pain Management Practitioners as being common knowledge and practice, at the time of the intervention. For Medico-Legal purposes, it is also important to point out that variation in procedural techniques and pharmacological choices are the acceptable norm. The indications, contraindications, technique, and results of the above procedure should only be interpreted and judged by a Board-Certified Interventional Pain Specialist with extensive familiarity and expertise in the same exact procedure and technique.

## 2022-02-08 ENCOUNTER — Encounter: Payer: Self-pay | Admitting: Pain Medicine

## 2022-02-08 ENCOUNTER — Ambulatory Visit: Payer: Federal, State, Local not specified - PPO | Attending: Pain Medicine | Admitting: Pain Medicine

## 2022-02-08 ENCOUNTER — Ambulatory Visit
Admission: RE | Admit: 2022-02-08 | Discharge: 2022-02-08 | Disposition: A | Discharge status: Home / Self Care | Payer: Federal, State, Local not specified - PPO | Source: Ambulatory Visit | Attending: Pain Medicine | Admitting: Pain Medicine

## 2022-02-08 VITALS — BP 110/70 | HR 78 | Temp 97.4°F | Resp 16 | Ht 74.0 in | Wt 220.0 lb

## 2022-02-08 DIAGNOSIS — M4310 Spondylolisthesis, site unspecified: Secondary | ICD-10-CM | POA: Diagnosis present

## 2022-02-08 DIAGNOSIS — M5386 Other specified dorsopathies, lumbar region: Secondary | ICD-10-CM | POA: Insufficient documentation

## 2022-02-08 DIAGNOSIS — G8929 Other chronic pain: Secondary | ICD-10-CM | POA: Insufficient documentation

## 2022-02-08 DIAGNOSIS — G8918 Other acute postprocedural pain: Secondary | ICD-10-CM | POA: Insufficient documentation

## 2022-02-08 DIAGNOSIS — M47817 Spondylosis without myelopathy or radiculopathy, lumbosacral region: Secondary | ICD-10-CM | POA: Diagnosis not present

## 2022-02-08 DIAGNOSIS — M5459 Other low back pain: Secondary | ICD-10-CM | POA: Diagnosis present

## 2022-02-08 DIAGNOSIS — M545 Low back pain, unspecified: Secondary | ICD-10-CM | POA: Diagnosis present

## 2022-02-08 DIAGNOSIS — M5136 Other intervertebral disc degeneration, lumbar region: Secondary | ICD-10-CM | POA: Insufficient documentation

## 2022-02-08 DIAGNOSIS — M47816 Spondylosis without myelopathy or radiculopathy, lumbar region: Secondary | ICD-10-CM | POA: Diagnosis present

## 2022-02-08 DIAGNOSIS — R937 Abnormal findings on diagnostic imaging of other parts of musculoskeletal system: Secondary | ICD-10-CM | POA: Diagnosis present

## 2022-02-08 DIAGNOSIS — M4316 Spondylolisthesis, lumbar region: Secondary | ICD-10-CM | POA: Diagnosis present

## 2022-02-08 DIAGNOSIS — M961 Postlaminectomy syndrome, not elsewhere classified: Secondary | ICD-10-CM | POA: Diagnosis present

## 2022-02-08 MED ORDER — HYDROCODONE-ACETAMINOPHEN 5-325 MG PO TABS: 1.0000 | ORAL_TABLET | Freq: Four times a day (QID) | ORAL | 0 refills | Status: AC | PRN

## 2022-02-08 MED ORDER — LIDOCAINE HCL 2 % IJ SOLN
20.0000 mL | Freq: Once | INTRAMUSCULAR | Status: AC
  Administered 2022-02-08: 400 mg
  Filled 2022-02-08: qty 20

## 2022-02-08 MED ORDER — TRIAMCINOLONE ACETONIDE 40 MG/ML IJ SUSP
40.0000 mg | Freq: Once | INTRAMUSCULAR | Status: DC
  Filled 2022-02-08: qty 1

## 2022-02-08 MED ORDER — MIDAZOLAM HCL 5 MG/5ML IJ SOLN: 0.5000 mg | Freq: Once | INTRAMUSCULAR | Status: DC

## 2022-02-08 MED ORDER — PENTAFLUOROPROP-TETRAFLUOROETH EX AERO
INHALATION_SPRAY | Freq: Once | CUTANEOUS | Status: AC
  Administered 2022-02-08: 30 via TOPICAL

## 2022-02-08 MED ORDER — ROPIVACAINE HCL 2 MG/ML IJ SOLN
9.0000 mL | Freq: Once | INTRAMUSCULAR | Status: AC
  Administered 2022-02-08: 9 mL via PERINEURAL
  Filled 2022-02-08: qty 20

## 2022-02-08 MED ORDER — TRIAMCINOLONE ACETONIDE 40 MG/ML IJ SUSP
INTRAMUSCULAR | Status: AC
  Filled 2022-02-08: qty 1

## 2022-02-08 MED ORDER — FENTANYL CITRATE (PF) 100 MCG/2ML IJ SOLN: 25.0000 ug | INTRAMUSCULAR | Status: DC | PRN

## 2022-02-08 MED ORDER — LACTATED RINGERS IV SOLN: Freq: Once | INTRAVENOUS | Status: DC

## 2022-02-08 NOTE — Progress Notes (Signed)
Safety precautions to be maintained throughout the outpatient stay will include: orient to surroundings, keep bed in low position, maintain call bell within reach at all times, provide assistance with transfer out of bed and ambulation.  

## 2022-02-08 NOTE — Patient Instructions (Signed)
___________________________________________________________________________________________  Post-Radiofrequency (RF) Discharge Instructions  You have just completed a Radiofrequency Neurotomy.  The following instructions will provide you with information and guidelines for self-care upon discharge.  If at any time you have questions or concerns please call your physician. DO NOT DRIVE YOURSELF!!  Instructions:  Apply ice: Fill a plastic sandwich bag with crushed ice. Cover it with a small towel and apply to injection site. Apply for 15 minutes then remove x 15 minutes. Repeat sequence on day of procedure, until you go to bed. The purpose is to minimize swelling and discomfort after procedure.  Apply heat: Apply heat to procedure site starting the day following the procedure. The purpose is to treat any soreness and discomfort from the procedure.  Food intake: No eating limitations, unless stipulated above.  Nevertheless, if you have had sedation, you may experience some nausea.  In this case, it may be wise to wait at least two hours prior to resuming regular diet.  Physical activities: Keep activities to a minimum for the first 8 hours after the procedure. For the first 24 hours after the procedure, do not drive a motor vehicle,  Operate heavy machinery, power tools, or handle any weapons.  Consider walking with the use of an assistive device or accompanied by an adult for the first 24 hours.  Do not drink alcoholic beverages including beer.  Do not make any important decisions or sign any legal documents. Go home and rest today.  Resume activities tomorrow, as tolerated.  Use caution in moving about as you may experience mild leg weakness.  Use caution in cooking, use of household electrical appliances and climbing steps.  Driving: If you have received any sedation, you are not allowed to drive for 24 hours after your procedure.  Blood thinner: Restart your blood thinner 6 hours after your  procedure. (Only for those taking blood thinners)  Insulin: As soon as you can eat, you may resume your normal dosing schedule. (Only for those taking insulin)  Medications: May resume pre-procedure medications.  Do not take any drugs, other than what has been prescribed to you.  Infection prevention: Keep procedure site clean and dry.  Post-procedure Pain Diary: Extremely important that this be done correctly and accurately. Recorded information will be used to determine the next step in treatment.  Pain evaluated is that of treated area only. Do not include pain from an untreated area.  Complete every hour, on the hour, for the initial 8 hours. Set an alarm to help you do this part accurately.  Do not go to sleep and have it completed later. It will not be accurate.  Follow-up appointment: Keep your follow-up appointment after the procedure. Usually 2-6 weeks after radiofrequency. Bring you pain diary. The information collected will be essential for your long-term care.   Expect:  From numbing medicine (AKA: Local Anesthetics): Numbness or decrease in pain.  Onset: Full effect within 15 minutes of injected.  Duration: It will depend on the type of local anesthetic used. On the average, 1 to 8 hours.   From steroids (when added): Decrease in swelling or inflammation. Once inflammation is improved, relief of the pain will follow.  Onset of benefits: Depends on the amount of swelling present. The more swelling, the longer it will take for the benefits to be seen. In some cases, up to 10 days.  Duration: Steroids will stay in the system x 2 weeks. Duration of benefits will depend on multiple posibilities including persistent irritating factors.    this may last as long as 6 weeks. Additional post-procedure pain medication is provided for this. Discomfort is minimized if ice and heat are applied as  instructed.  Call if: You experience numbness and weakness that gets worse with time, as opposed to wearing off. He experience any unusual bleeding, difficulty breathing, or loss of the ability to control your bowel and bladder. (This applies to Spinal procedures only) You experience any redness, swelling, heat, red streaks, elevated temperature, fever, or any other signs of a possible infection.  Emergency Numbers: Durning business hours (Monday - Thursday, 8:00 AM - 4:00 PM) (Friday, 9:00 AM - 12:00 Noon): [phone] After hours: [phone] ____________________________________________________________________________________________    ______________________________________________________________________  Procedure instructions  Do not eat or drink fluids (other than water) for 6 hours before your procedure  No water for 2 hours before your procedure  Take your blood pressure medicine with a sip of water  Arrive 30 minutes before your appointment  Carefully read the "Preparing for your procedure" detailed instructions  If you have questions call us at [phone]  _____________________________________________________________________    ______________________________________________________________________  Preparing for your procedure  During your procedure appointment there will be: No Prescription Refills. No disability issues to discussed. No medication changes or discussions.  Instructions: Food intake: Avoid eating anything solid for at least 8 hours prior to your procedure. Clear liquid intake: You may take clear liquids such as water up to 2 hours prior to your procedure. (No carbonated drinks. No soda.) Transportation: Unless otherwise stated by your physician, bring a driver. Morning Medicines: Except for blood thinners, take all of your other morning medications with a sip of water. Make sure to take your heart and blood pressure medicines. If your  blood pressure's lower number is above 100, the case will be rescheduled. Blood thinners: Make sure to stop your blood thinners as instructed.  If you take a blood thinner, but were not instructed to stop it, call our office [phone] and ask to talk to a nurse. Not stopping a blood thinner prior to certain procedures could lead to serious complications. Diabetics on insulin: Notify the staff so that you can be scheduled 1st case in the morning. If your diabetes requires high dose insulin, take only  of your normal insulin dose the morning of the procedure and notify the staff that you have done so. Preventing infections: Shower with an antibacterial soap the morning of your procedure.  Build-up your immune system: Take 1000 mg of Vitamin C with every meal (3 times a day) the day prior to your procedure. Antibiotics: Inform the nursing staff if you are taking any antibiotics or if you have any conditions that may require antibiotics prior to procedures. (Example: recent joint implants)   Pregnancy: If you are pregnant make sure to notify the nursing staff. Not doing so may result in injury to the fetus, including death.  Sickness: If you have a cold, fever, or any active infections, call and cancel or reschedule your procedure. Receiving steroids while having an infection may result in complications. Arrival: You must be in the facility at least 30 minutes prior to your scheduled procedure. Tardiness: Your scheduled time is also the cutoff time. If you do not arrive at least 15 minutes prior to your procedure, you will be rescheduled.  Children: Do not bring any children with you. Make arrangements to keep them home. Dress appropriately: There is always a possibility that your clothing may get soiled. Avoid long   dresses. Valuables: Do not bring any jewelry or valuables.  Reasons to call and reschedule or cancel your procedure: (Following these recommendations will minimize the risk of a serious  complication.) Surgeries: Avoid having procedures within 2 weeks of any surgery. (Avoid for 2 weeks before or after any surgery). Flu Shots: Avoid having procedures within 2 weeks of a flu shots or . (Avoid for 2 weeks before or after immunizations). Barium: Avoid having a procedure within 7-10 days after having had a radiological study involving the use of radiological contrast. (Myelograms, Barium swallow or enema study). Heart attacks: Avoid any elective procedures or surgeries for the initial 6 months after a "Myocardial Infarction" (Heart Attack). Blood thinners: It is imperative that you stop these medications before procedures. Let us know if you if you take any blood thinner.  Infection: Avoid procedures during or within two weeks of an infection (including chest colds or gastrointestinal problems). Symptoms associated with infections include: Localized redness, fever, chills, night sweats or profuse sweating, burning sensation when voiding, cough, congestion, stuffiness, runny nose, sore throat, diarrhea, nausea, vomiting, cold or Flu symptoms, recent or current infections. It is specially important if the infection is over the area that we intend to treat. Heart and lung problems: Symptoms that may suggest an active cardiopulmonary problem include: cough, chest pain, breathing difficulties or shortness of breath, dizziness, ankle swelling, uncontrolled high or unusually low blood pressure, and/or palpitations. If you are experiencing any of these symptoms, cancel your procedure and contact your primary care physician for an evaluation.  Remember:  Regular Business hours are:  Monday to Thursday 8:00 AM to 4:00 PM  Provider's Schedule: Aslee Such, MD:  Procedure days: Tuesday and Thursday 7:30 AM to 4:00 PM  Bilal Lateef, MD:  Procedure days: Monday and Wednesday 7:30 AM to 4:00 PM  ______________________________________________________________________     ____________________________________________________________________________________________  General Risks and Possible Complications  Patient Responsibilities: It is important that you read this as it is part of your informed consent. It is our duty to inform you of the risks and possible complications associated with treatments offered to you. It is your responsibility as a patient to read this and to ask questions about anything that is not clear or that you believe was not covered in this document.  Patient's Rights: You have the right to refuse treatment. You also have the right to change your mind, even after initially having agreed to have the treatment done. However, under this last option, if you wait until the last second to change your mind, you may be charged for the materials used up to that point.  Introduction: Medicine is not an exact science. Everything in Medicine, including the lack of treatment(s), carries the potential for danger, harm, or loss (which is by definition: Risk). In Medicine, a complication is a secondary problem, condition, or disease that can aggravate an already existing one. All treatments carry the risk of possible complications. The fact that a side effects or complications occurs, does not imply that the treatment was conducted incorrectly. It must be clearly understood that these can happen even when everything is done following the highest safety standards.  No treatment: You can choose not to proceed with the proposed treatment alternative. The "PRO(s)" would include: avoiding the risk of complications associated with the therapy. The "CON(s)" would include: not getting any of the treatment benefits. These benefits fall under one of three categories: diagnostic; therapeutic; and/or palliative. Diagnostic benefits include: getting information which can ultimately lead   to improvement of the disease or symptom(s). Therapeutic benefits are those associated with  the successful treatment of the disease. Finally, palliative benefits are those related to the decrease of the primary symptoms, without necessarily curing the condition (example: decreasing the pain from a flare-up of a chronic condition, such as incurable terminal cancer).  General Risks and Complications: These are associated to most interventional treatments. They can occur alone, or in combination. They fall under one of the following six (6) categories: no benefit or worsening of symptoms; bleeding; infection; nerve damage; allergic reactions; and/or death. No benefits or worsening of symptoms: In Medicine there are no guarantees, only probabilities. No healthcare provider can ever guarantee that a medical treatment will work, they can only state the probability that it may. Furthermore, there is always the possibility that the condition may worsen, either directly, or indirectly, as a consequence of the treatment. Bleeding: This is more common if the patient is taking a blood thinner, either prescription or over the counter (example: Goody Powders, Fish oil, Aspirin, Garlic, etc.), or if suffering a condition associated with impaired coagulation (example: Hemophilia, cirrhosis of the liver, low platelet counts, etc.). However, even if you do not have one on these, it can still happen. If you have any of these conditions, or take one of these drugs, make sure to notify your treating physician. Infection: This is more common in patients with a compromised immune system, either due to disease (example: diabetes, cancer, human immunodeficiency virus [HIV], etc.), or due to medications or treatments (example: therapies used to treat cancer and rheumatological diseases). However, even if you do not have one on these, it can still happen. If you have any of these conditions, or take one of these drugs, make sure to notify your treating physician. Nerve Damage: This is more common when the treatment is an  invasive one, but it can also happen with the use of medications, such as those used in the treatment of cancer. The damage can occur to small secondary nerves, or to large primary ones, such as those in the spinal cord and brain. This damage may be temporary or permanent and it may lead to impairments that can range from temporary numbness to permanent paralysis and/or brain death. Allergic Reactions: Any time a substance or material comes in contact with our body, there is the possibility of an allergic reaction. These can range from a mild skin rash (contact dermatitis) to a severe systemic reaction (anaphylactic reaction), which can result in death. Death: In general, any medical intervention can result in death, most of the time due to an unforeseen complication. ____________________________________________________________________________________________    

## 2022-02-09 ENCOUNTER — Telehealth: Payer: Self-pay

## 2022-02-09 NOTE — Telephone Encounter (Signed)
Post procedure follow up..  Patient states he is doing good 

## 2022-02-19 NOTE — Progress Notes (Unsigned)
PROVIDER NOTE: Interpretation of information contained herein should be left to medically-trained personnel. Specific patient instructions are provided elsewhere under "Patient Instructions" section of medical record. This document was created in part using STT-dictation technology, any transcriptional errors that may result from this process are unintentional.  Patient: Jonathan Stephens Type: Established DOB: 1960/06/03 MRN: [id number] PCP: Barbaraann Boys, MD  Service: Procedure DOS: 02/20/2022 Setting: Ambulatory Location: Ambulatory outpatient facility Delivery: Face-to-face Provider: Gaspar Cola, MD Specialty: Interventional Pain Management Specialty designation: 09 Location: Outpatient facility Ref. Prov.: Barbaraann Boys, MD       Interventional Therapy   Procedure: Lumbar Facet, Medial Branch Radiofrequency Ablation (RFA) #1  Laterality: Left (-LT)  Level: L4, L5, and S1 Medial Branch Level(s). These levels will denervate the L5-S1 lumbar facet joints.  Imaging: Fluoroscopy-guided         Anesthesia: Local anesthesia (1-2% Lidocaine) Anxiolysis: None  Sedation: No Sedation  The patient was not able to secure a ride and decided to have the procedure done without sedation.          DOS: 02/20/2022  Performed by: Gaspar Cola, MD  Purpose: Therapeutic/Palliative Indications: Low back pain severe enough to impact quality of life or function. Indications: 1. Lumbosacral facet joint syndrome (Bilateral)   2. Lumbar facet joint pain   3. Lumbar facet arthropathy (Multilevel) (Bilateral)   4. Spondylosis without myelopathy or radiculopathy, lumbosacral region   5. Grade 1 Retrolisthesis of L3/L4   6. 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 Chronic low back pain (1ry area of Pain) (Bilateral) (R>L) w/o sciatica   8. Abnormal MRI, lumbar spine (02/28/2017 & 09/22/2021)    Jonathan Stephens has been dealing with the above chronic pain for longer than three months and  has either failed to respond, was unable to tolerate, or simply did not get enough benefit from other more conservative therapies including, but not limited to: 1. Over-the-counter medications 2. Anti-inflammatory medications 3. Muscle relaxants 4. Membrane stabilizers 5. Opioids 6. Physical therapy and/or chiropractic manipulation 7. Modalities (Heat, ice, etc.) 8. Invasive techniques such as nerve blocks. Jonathan Stephens has attained more than 50% relief of the pain from a series of diagnostic injections conducted in separate occasions.  Pain Score: Pre-procedure: 7 /10 Post-procedure: 7 /10     Position / Prep / Materials:  Position: Prone  Prep solution: DuraPrep (Iodine Povacrylex [0.7% available iodine] and Isopropyl Alcohol, 74% w/w) Prep Area: Entire Lumbosacral Region (Lower back from mid-thoracic region to end of tailbone and from flank to flank.) Materials:  Tray: RFA (Radiofrequency) tray Needle(s):  Type: RFA (Teflon-coated radiofrequency ablation needles) Gauge (G): 22  Length: Regular (10cm) Qty: 4      Post-procedure evaluation   Type: Lumbar Facet, Medial Branch Radiofrequency Ablation (RFA) #1  Laterality: Right (-RT)  Level: L4, L5, and S1 Medial Branch Level(s). These levels will denervate the L5-S1 lumbar facet joints.  Imaging: Fluoroscopy-guided         Anesthesia: Local anesthesia (1-2% Lidocaine) Anxiolysis: IV Versed         Sedation: Moderate Sedation                       DOS: 02/08/2022  Performed by: Gaspar Cola, MD  Purpose: Therapeutic/Palliative Indications: Low back pain severe enough to impact quality of life or function.  Pain Score: Pre-procedure: 3 /10 Post-procedure: 3 /10     Effectiveness:  Initial hour after procedure: 90 %. Subsequent  4-6 hours post-procedure: 90 %. Analgesia past initial 6 hours: 30 %. Ongoing improvement:  Analgesic: It has been only 2 weeks since the radiofrequency and therefore has not completed  recovering from the procedure.  This is estimated to be approximately 6 weeks. Function: [name] reports improvement in function ROM: Mr. Dann reports improvement in ROM  Pre-op H&P Assessment:  Jonathan Stephens is a 62 y.o. (year old), male patient, seen today for interventional treatment. 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 Jonathan Stephens has a current medication list which includes the following prescription(s): amlodipine, atorvastatin, duloxetine, epinephrine, ferrous sulfate, fluticasone, gabapentin, hydrocodone-acetaminophen, hydrocodone-acetaminophen, [START ON 02/27/2022] hydrocodone-acetaminophen, ibuprofen, multivitamin with minerals, naloxone, fish oil, oxycodone-acetaminophen, potassium chloride er, sertraline, trazodone, trazodone, and vitamin e. His primarily concern today is the Back Pain (Lower worse on right side)  Initial Vital Signs:  Pulse/HCG Rate: 76ECG Heart Rate: 72 Temp: (!) 97.2 F (36.2 C) Resp: 16 BP: (!) 142/75 SpO2: 98 %  BMI: Estimated body mass index is 28.25 kg/m as calculated from the following:   Height as of this encounter: 6' 2"$  (1.88 m).   Weight as of this encounter: 220 lb (99.8 kg).  Risk Assessment: Allergies: Reviewed. He is allergic to bee venom.  Allergy Precautions: None required Coagulopathies: Reviewed. None identified.  Blood-thinner therapy: None at this time Active Infection(s): Reviewed. None identified. Jonathan Stephens is afebrile  Site Confirmation: Jonathan Stephens was asked to confirm the procedure and laterality before marking the site Procedure checklist: Completed Consent: Before the procedure and under the influence of no sedative(s), amnesic(s), or anxiolytics, the patient was informed of the treatment options, risks and possible complications. To  fulfill our ethical and legal obligations, as recommended by the American Medical Association's Code of Ethics, I have informed the patient of my clinical impression; the nature and purpose of the treatment or procedure; the risks, benefits, and possible complications of the intervention; the alternatives, including doing nothing; the risk(s) and benefit(s) of the alternative treatment(s) or procedure(s); and the risk(s) and benefit(s) of doing nothing. The patient was provided information about the general risks and possible complications associated with the procedure. These may include, but are not limited to: failure to achieve desired goals, infection, bleeding, organ or nerve damage, allergic reactions, paralysis, and death. In addition, the patient was informed of those risks and complications associated to Spine-related procedures, such as failure to decrease pain; infection (i.e.: Meningitis, epidural or intraspinal abscess); bleeding (i.e.: epidural hematoma, subarachnoid hemorrhage, or any other type of intraspinal or peri-dural bleeding); organ or nerve damage (i.e.: Any type of peripheral nerve, nerve root, or spinal cord injury) with subsequent damage to sensory, motor, and/or autonomic systems, resulting in permanent pain, numbness, and/or weakness of one or several areas of the body; allergic reactions; (i.e.: anaphylactic reaction); and/or death. Furthermore, the patient was informed of those risks and complications associated with the medications. These include, but are not limited to: allergic reactions (i.e.: anaphylactic or anaphylactoid reaction(s)); adrenal axis suppression; blood sugar elevation that in diabetics may result in ketoacidosis or comma; water retention that in patients with history of congestive heart failure may result in shortness of breath, pulmonary edema, and decompensation with resultant heart failure; weight gain; swelling or edema; medication-induced neural toxicity;  particulate matter embolism and blood vessel occlusion with resultant organ, and/or nervous system infarction; and/or  aseptic necrosis of one or more joints. Finally, the patient was informed that Medicine is not an exact science; therefore, there is also the possibility of unforeseen or unpredictable risks and/or possible complications that may result in a catastrophic outcome. The patient indicated having understood very clearly. We have given the patient no guarantees and we have made no promises. Enough time was given to the patient to ask questions, all of which were answered to the patient's satisfaction. Mr. Pasierb has indicated that he wanted to continue with the procedure. Attestation: I, the ordering provider, attest that I have discussed with the patient the benefits, risks, side-effects, alternatives, likelihood of achieving goals, and potential problems during recovery for the procedure that I have provided informed consent. Date  Time: 02/20/2022  7:57 AM   Pre-Procedure Preparation:  Monitoring: As per clinic protocol. Respiration, ETCO2, SpO2, BP, heart rate and rhythm monitor placed and checked for adequate function Safety Precautions: Patient was assessed for positional comfort and pressure points before starting the procedure. Time-out: I initiated and conducted the "Time-out" before starting the procedure, as per protocol. The patient was asked to participate by confirming the accuracy of the "Time Out" information. Verification of the correct person, site, and procedure were performed and confirmed by me, the nursing staff, and the patient. "Time-out" conducted as per Joint Commission's Universal Protocol (UP.01.01.01). Time: [id number]  Description of Procedure:          Laterality: See above. Levels:  See above. Safety Precautions: Aspiration looking for blood return was conducted prior to all injections. At no point did we inject any substances, as a needle was being advanced. Before  injecting, the patient was told to immediately notify me if he was experiencing any new onset of "ringing in the ears, or metallic taste in the mouth". No attempts were made at seeking any paresthesias. Safe injection practices and needle disposal techniques used. Medications properly checked for expiration dates. SDV (single dose vial) medications used. After the completion of the procedure, all disposable equipment used was discarded in the proper designated medical waste containers. Local Anesthesia: Protocol guidelines were followed. The patient was positioned over the fluoroscopy table. The area was prepped in the usual manner. The time-out was completed. The target area was identified using fluoroscopy. A 12-in long, straight, sterile hemostat was used with fluoroscopic guidance to locate the targets for each level blocked. Once located, the skin was marked with an approved surgical skin marker. Once all sites were marked, the skin (epidermis, dermis, and hypodermis), as well as deeper tissues (fat, connective tissue and muscle) were infiltrated with a small amount of a short-acting local anesthetic, loaded on a 10cc syringe with a 25G, 1.5-in  Needle. An appropriate amount of time was allowed for local anesthetics to take effect before proceeding to the next step. Technical description of process:   L4 Medial Branch Nerve PRF: The target area for the L4 medial branch is at the junction of the postero-lateral aspect of the superior articular process and the superior, posterior, and medial edge of the transverse process of L5. Under fluoroscopic guidance, a Radiofrequency needle was inserted until contact was made with os over the superior postero-lateral aspect of the pedicular shadow (target area). Sensory and motor testing was conducted to properly adjust the position of the needle. Once satisfactory placement of the needle was achieved, the numbing solution was slowly injected after negative aspiration  for blood. 2.0 mL of the nerve block solution was injected without difficulty or complication.  After waiting for at least 3 minutes, the ablation was performed. Once completed, the needle was removed intact. Pulsed Radiofrequency lesioning:  Radiofrequency Generator: Medtronic AccurianTM AG 1000 RF Generator Sensory Stimulation Parameters: 50 Hz was used to locate & identify the nerve, making sure that the needle was positioned such that there was no sensory stimulation below 0.3 V or above 0.7 V. Motor Stimulation Parameters: 2 Hz was used to evaluate the motor component. Care was taken not to lesion any nerves that demonstrated motor stimulation of the lower extremities at an output of less than 2.5 times that of the sensory threshold, or a maximum of 2.0 V. Lesioning Technique Parameters: Pulsed Radiofrequency settings. Temperature Settings: 42 degrees C Lesioning time: 120 seconds  L5 Medial Branch Nerve RFA: The target area for the L5 medial branch is at the junction of the postero-lateral aspect of the superior articular process of S1 and the superior, posterior, and medial edge of the sacral ala. Under fluoroscopic guidance, a Radiofrequency needle was inserted until contact was made with os over the superior postero-lateral aspect of the pedicular shadow (target area). Sensory and motor testing was conducted to properly adjust the position of the needle. Once satisfactory placement of the needle was achieved, the numbing solution was slowly injected after negative aspiration for blood. 2.0 mL of the nerve block solution was injected without difficulty or complication. After waiting for at least 3 minutes, the ablation was performed. Once completed, the needle was removed intact.  S1 Medial Branch Nerve RFA: The target area for the S1 medial branch is located inferior to the junction of the S1 superior articular process and the L5 inferior articular process, posterior, inferior, and lateral to the  6 o'clock position of the L5-S1 facet joint, just superior to the S1 posterior foramen. Under fluoroscopic guidance, the Radiofrequency needle was advanced until contact was made with os over the Target area. Sensory and motor testing was conducted to properly adjust the position of the needle. Once satisfactory placement of the needle was achieved, the numbing solution was slowly injected after negative aspiration for blood. 2.0 mL of the nerve block solution was injected without difficulty or complication. After waiting for at least 3 minutes, the ablation was performed. Once completed, the needle was removed intact. Radiofrequency lesioning (ablation):  Radiofrequency Generator: Medtronic AccurianTM AG 1000 RF Generator Sensory Stimulation Parameters: 50 Hz was used to locate & identify the nerve, making sure that the needle was positioned such that there was no sensory stimulation below 0.3 V or above 0.7 V. Motor Stimulation Parameters: 2 Hz was used to evaluate the motor component. Care was taken not to lesion any nerves that demonstrated motor stimulation of the lower extremities at an output of less than 2.5 times that of the sensory threshold, or a maximum of 2.0 V. Lesioning Technique Parameters: Standard Radiofrequency settings. (Not bipolar or pulsed.) Temperature Settings: 80 degrees C Lesioning time: 60 seconds  Intra-operative Compliance: Compliant  Once the entire procedure was completed, the treated area was cleaned, making sure to leave some of the prepping solution back to take advantage of its long term bactericidal properties.    Illustration of the posterior view of the lumbar spine and the posterior neural structures. Laminae of L2 through S1 are labeled. DPRL5, dorsal primary ramus of L5; DPRS1, dorsal primary ramus of S1; DPR3, dorsal primary ramus of L3; FJ, facet (zygapophyseal) joint L3-L4; I, inferior articular process of L4; LB1, lateral branch of dorsal primary ramus of  L1; IAB, inferior articular branches from L3 medial branch (supplies L4-L5 facet joint); IBP, intermediate branch plexus; MB3, medial branch of dorsal primary ramus of L3; NR3, third lumbar nerve root; S, superior articular process of L5; SAB, superior articular branches from L4 (supplies L4-5 facet joint also); TP3, transverse process of L3.  Vitals:   02/20/22 0837 02/20/22 0842 02/20/22 0847 02/20/22 0852  BP: 131/75 132/81 126/75 129/78  Pulse:      Resp: 18 15 16 18  $ Temp:      TempSrc:      SpO2: 99% 99% 99% 99%  Weight:      Height:        Start Time: 0824 hrs. End Time: 0852 hrs.  Imaging Guidance (Spinal):          Type of Imaging Technique: Fluoroscopy Guidance (Spinal) Indication(s): Assistance in needle guidance and placement for procedures requiring needle placement in or near specific anatomical locations not easily accessible without such assistance. Exposure Time: Please see nurses notes. Contrast: None used. Fluoroscopic Guidance: I was personally present during the use of fluoroscopy. "Tunnel Vision Technique" used to obtain the best possible view of the target area. Parallax error corrected before commencing the procedure. "Direction-depth-direction" technique used to introduce the needle under continuous pulsed fluoroscopy. Once target was reached, antero-posterior, oblique, and lateral fluoroscopic projection used confirm needle placement in all planes. Images permanently stored in EMR. Interpretation: No contrast injected. I personally interpreted the imaging intraoperatively. Adequate needle placement confirmed in multiple planes. Permanent images saved into the patient's record.  Antibiotic Prophylaxis:   Anti-infectives (From admission, onward)    None      Indication(s): None identified  Post-operative Assessment:  Post-procedure Vital Signs:  Pulse/HCG Rate: 7670 Temp: (!) 97.2 F (36.2 C) Resp: 18 BP: 129/78 SpO2: 99 %  EBL:  None  Complications: No immediate post-treatment complications observed by team, or reported by patient.  Note: The patient tolerated the entire procedure well. A repeat set of vitals were taken after the procedure and the patient was kept under observation following institutional policy, for this type of procedure. Post-procedural neurological assessment was performed, showing return to baseline, prior to discharge. The patient was provided with post-procedure discharge instructions, including a section on how to identify potential problems. Should any problems arise concerning this procedure, the patient was given instructions to immediately contact us, at any time, without hesitation. In any case, we plan to contact the patient by telephone for a follow-up status report regarding this interventional procedure.  Comments:  No additional relevant information.  Plan of Care (POC)  Orders:  Orders Placed This Encounter  Procedures   Radiofrequency,Lumbar    Scheduling Instructions:     Side(s): Left-sided     Level: L5-S1 Facets (L4, L5, and S1 Medial Branch)     Sedation: With Sedation.     Timeframe: Today    Order Specific Question:   Where will this procedure be performed?    Answer:   [facility]   DG [facility] C-ARM 1-60 MIN NO REPORT    Intraoperative interpretation by procedural physician at [place].    Standing Status:   Standing    Number of Occurrences:   1    Order Specific Question:   Reason for exam:    Answer:   Assistance in needle guidance and placement for procedures requiring needle placement in or near specific anatomical locations not easily accessible without such assistance.   Informed Consent Details:  Physician/Practitioner Attestation; Transcribe to consent form and obtain patient signature    Nursing Order: Transcribe to consent form and obtain patient signature. Note: Always confirm laterality of pain with Mr. Rumbaugh, before procedure.     Order Specific Question:   Physician/Practitioner attestation of informed consent for procedure/surgical case    Answer:   I, the physician/practitioner, attest that I have discussed with the patient the benefits, risks, side effects, alternatives, likelihood of achieving goals and potential problems during recovery for the procedure that I have provided informed consent.    Order Specific Question:   Procedure    Answer:   Lumbar Facet Radiofrequency Ablation    Order Specific Question:   Physician/Practitioner performing the procedure    Answer:   Samie Reasons A. Dossie Arbour, MD    Order Specific Question:   Indication/Reason    Answer:   Low Back Pain, with our without leg pain, due to Facet Joint Arthralgia (Joint Pain) known as Lumbar Facet Syndrome, secondary to Lumbar, and/or Lumbosacral Spondylosis (Arthritis of the Spine), without myelopathy or radiculopathy (Nerve Damage).   Provide equipment / supplies at bedside    Procedure tray: "Radiofrequency Tray" Additional material: Large hemostat (x1); Small hemostat (x1); Towels (x8); 4x4 sterile sponge pack (x1) Needle type: Teflon-coated Radiofrequency Needle (Disposable  single use) Size: Regular Quantity: 3    Standing Status:   Standing    Number of Occurrences:   1    Order Specific Question:   Specify    Answer:   Radiofrequency Tray   Schedule appointment    Schedule on my procedure day (Tuesday/Thursday).    Standing Status:   Standing    Number of Occurrences:   1    Order Specific Question:   Specify    Answer:   (F2F) (Post-RFA Eval)   Chronic Opioid Analgesic:  No chronic opioid analgesics therapy prescribed by our practice. Oxycodone/APAP 5/325, 1 tab p.o. twice daily. MME/day: 15 mg/day   Medications ordered for procedure: Meds ordered this encounter  Medications   lidocaine (XYLOCAINE) 2 % (with pres) injection 400 mg   pentafluoroprop-tetrafluoroeth (GEBAUERS) aerosol   ropivacaine (PF) 2 mg/mL (0.2%) (NAROPIN)  injection 9 mL   triamcinolone acetonide (KENALOG-40) injection 40 mg   HYDROcodone-acetaminophen (NORCO/VICODIN) 5-325 MG tablet    Sig: Take 1 tablet by mouth every 6 (six) hours as needed for up to 7 days for severe pain. Must last 7 days.    Dispense:  28 tablet    Refill:  0    For acute post-operative pain. Not to be refilled. Must last 7 days.   HYDROcodone-acetaminophen (NORCO/VICODIN) 5-325 MG tablet    Sig: Take 1 tablet by mouth every 6 (six) hours as needed for up to 7 days for severe pain. Must last 7 days.    Dispense:  28 tablet    Refill:  0    For acute post-operative pain. Not to be refilled.  Must last 7 days.   Medications administered: We administered lidocaine, pentafluoroprop-tetrafluoroeth, ropivacaine (PF) 2 mg/mL (0.2%), and triamcinolone acetonide.  See the medical record for exact dosing, route, and time of administration.  Follow-up plan:   Return in about 6 weeks (around 04/03/2022) for Proc-day (T,Th), (Face2F), (PPE).       Interventional Therapies  Risk Factors  Considerations:       Extensive Lumbar HARDWARE   Planned  Pending:      Under consideration:   Diagnostic/therapeutic right greater trochanteric bursa injection #1    Completed:  Therapeutic left (L5-S1) lumbar facet RFA x1 (02/20/2022)  Therapeutic right (L5-S1) lumbar facet RFA x1 (02/08/2022) (2wk FU:90/90/30/30)  Diagnostic/therapeutic right greater trochanteric bursa inj. x1 (12/12/2021) (100/100/100/100)  Diagnostic bilateral lumbar facet MBB (L2-S1) x1 (12/12/2021) (8 to 0/10) (100/100/100 x 3 days/LBP:0 LEP:100)  Diagnostic bilateral lumbar facet MBB (L4-S1) x2 (01/11/2022) (7 to R:2/10 L:0/10) (100/100/50/0)    Completed by other providers:   Diagnostic/therapeutic left L2-3 TFESI x1 (03/04/2017) by Etheleen Mayhew, MD ([place])  [place] left IA hip injection x2 (04/12/2017, 05/08/2017) by Etheleen Mayhew, MD ([place])   [place] left L4-5 and L5-S1 TFESI x3 (05/29/2017, 09/20/2017, 12/12/2018) by Etheleen Mayhew, MD ([place])  Diagnostic/therapeutic right L4-5 and L5-S1 TESI x1 (06/26/2019)  by Etheleen Mayhew, MD ([place])  Therapeutic left total hip replacement (THR) x1 (09/30/2018) by P.  Dalldorf, MD  Therapeutic left L2-3, L3-4, L4-5 anterior lateral lumbar interbody fusion w/ percutaneous pedicle screw fixation (left) (12/03/2019) by Dr. Erline Levine     Therapeutic  Palliative (PRN) options:   None established        Recent Visits Date Type Provider Dept  02/08/22 Procedure visit Milinda Pointer, MD [facility]  01/11/22 Procedure visit Milinda Pointer, MD [facility]  12/21/21 Office Visit Milinda Pointer, MD [facility]  12/12/21 Procedure visit Milinda Pointer, MD [facility]  12/06/21 Office Visit Milinda Pointer, MD [facility]  Showing recent visits within past 90 days and meeting all other requirements Today's Visits Date Type Provider Dept  02/20/22 Procedure visit Milinda Pointer, MD [facility]  Showing today's visits and meeting all other requirements Future Appointments Date Type Provider Dept  04/03/22 Appointment Milinda Pointer, MD [facility]  Showing future appointments within next 90 days and meeting all other requirements  Disposition: Discharge home  Discharge (Date  Time): 02/20/2022;   hrs.   Primary Care Physician: Barbaraann Boys, MD Location: [facility] Note by: Gaspar Cola, MD (TTS technology used. I apologize for any typographical errors that were not detected and corrected.) Date: 02/20/2022; Time: 9:32 AM  Disclaimer:  Medicine is not an Chief Strategy Officer. The only guarantee in medicine is that nothing is guaranteed. It is important to note that the decision to proceed with this intervention was based  on the information collected from the patient. The Data and conclusions were drawn from the patient's questionnaire, the interview, and the physical examination. Because the information was provided in large part by the patient, it cannot be guaranteed that it has not been purposely or unconsciously manipulated. Every effort has been made to obtain as much relevant data as possible for this evaluation. It is important to note that the conclusions that lead to this procedure are derived in large part from the available data. Always take into account that the treatment will also be dependent on availability of resources and existing treatment guidelines, considered by other Pain Management Practitioners as being common knowledge and practice, at the time of the intervention. For Medico-Legal purposes, it is also important to point out that variation in procedural techniques and pharmacological choices are the acceptable norm. The indications, contraindications, technique, and results of the above procedure should only be interpreted and judged by a Board-Certified Interventional Pain Specialist with extensive familiarity and expertise in the same exact procedure and technique.

## 2022-02-20 ENCOUNTER — Ambulatory Visit: Admission: RE | Admit: 2022-02-20 | Payer: Federal, State, Local not specified - PPO | Source: Ambulatory Visit

## 2022-02-20 ENCOUNTER — Ambulatory Visit
Admission: RE | Admit: 2022-02-20 | Discharge: 2022-02-20 | Disposition: A | Discharge status: Home / Self Care | Payer: Federal, State, Local not specified - PPO | Source: Ambulatory Visit | Attending: Pain Medicine | Admitting: Pain Medicine

## 2022-02-20 ENCOUNTER — Ambulatory Visit: Payer: Federal, State, Local not specified - PPO | Attending: Pain Medicine | Admitting: Pain Medicine

## 2022-02-20 ENCOUNTER — Encounter: Payer: Self-pay | Admitting: Pain Medicine

## 2022-02-20 VITALS — BP 129/78 | HR 76 | Temp 97.2°F | Resp 18 | Ht 74.0 in | Wt 220.0 lb

## 2022-02-20 DIAGNOSIS — M545 Low back pain, unspecified: Secondary | ICD-10-CM | POA: Diagnosis present

## 2022-02-20 DIAGNOSIS — G8929 Other chronic pain: Secondary | ICD-10-CM | POA: Diagnosis present

## 2022-02-20 DIAGNOSIS — G8918 Other acute postprocedural pain: Secondary | ICD-10-CM

## 2022-02-20 DIAGNOSIS — M47816 Spondylosis without myelopathy or radiculopathy, lumbar region: Secondary | ICD-10-CM | POA: Insufficient documentation

## 2022-02-20 DIAGNOSIS — M4316 Spondylolisthesis, lumbar region: Secondary | ICD-10-CM

## 2022-02-20 DIAGNOSIS — M5459 Other low back pain: Secondary | ICD-10-CM | POA: Diagnosis present

## 2022-02-20 DIAGNOSIS — R937 Abnormal findings on diagnostic imaging of other parts of musculoskeletal system: Secondary | ICD-10-CM | POA: Diagnosis present

## 2022-02-20 DIAGNOSIS — M4310 Spondylolisthesis, site unspecified: Secondary | ICD-10-CM

## 2022-02-20 DIAGNOSIS — M47817 Spondylosis without myelopathy or radiculopathy, lumbosacral region: Secondary | ICD-10-CM | POA: Insufficient documentation

## 2022-02-20 MED ORDER — PENTAFLUOROPROP-TETRAFLUOROETH EX AERO
INHALATION_SPRAY | Freq: Once | CUTANEOUS | Status: AC
  Administered 2022-02-20: 30 via TOPICAL
  Filled 2022-02-20: qty 116

## 2022-02-20 MED ORDER — LIDOCAINE HCL 2 % IJ SOLN
20.0000 mL | Freq: Once | INTRAMUSCULAR | Status: AC
  Administered 2022-02-20: 400 mg

## 2022-02-20 MED ORDER — HYDROCODONE-ACETAMINOPHEN 5-325 MG PO TABS: 1.0000 | ORAL_TABLET | Freq: Four times a day (QID) | ORAL | 0 refills | Status: AC | PRN

## 2022-02-20 MED ORDER — LIDOCAINE HCL 2 % IJ SOLN
INTRAMUSCULAR | Status: AC
  Filled 2022-02-20: qty 20

## 2022-02-20 MED ORDER — TRIAMCINOLONE ACETONIDE 40 MG/ML IJ SUSP
INTRAMUSCULAR | Status: AC
  Filled 2022-02-20: qty 1

## 2022-02-20 MED ORDER — ROPIVACAINE HCL 2 MG/ML IJ SOLN
9.0000 mL | Freq: Once | INTRAMUSCULAR | Status: AC
  Administered 2022-02-20: 9 mL via PERINEURAL

## 2022-02-20 MED ORDER — ROPIVACAINE HCL 2 MG/ML IJ SOLN
INTRAMUSCULAR | Status: AC
  Filled 2022-02-20: qty 20

## 2022-02-20 MED ORDER — TRIAMCINOLONE ACETONIDE 40 MG/ML IJ SUSP
40.0000 mg | Freq: Once | INTRAMUSCULAR | Status: AC
  Administered 2022-02-20: 40 mg

## 2022-02-20 NOTE — Patient Instructions (Signed)

## 2022-02-21 ENCOUNTER — Telehealth: Payer: Self-pay

## 2022-02-21 NOTE — Telephone Encounter (Signed)
Post procedure follow up.  Patient states she is doing good.  

## 2022-03-02 DIAGNOSIS — F1190 Opioid use, unspecified, uncomplicated: Secondary | ICD-10-CM | POA: Insufficient documentation

## 2022-04-02 NOTE — Progress Notes (Unsigned)
PROVIDER NOTE: Information contained herein reflects review and annotations entered in association with encounter. Interpretation of such information and data should be left to medically-trained personnel. Information provided to patient can be located elsewhere in the medical record under "Patient Instructions". Document created using STT-dictation technology, any transcriptional errors that may result from process are unintentional.    Patient: Jonathan Stephens  Service Category: E/M  Provider: Gaspar Cola, MD  DOB: April 06, 1960  DOS: 04/03/2022  Referring Provider: Barbaraann Boys, MD  MRN: [id number]  Specialty: Interventional Pain Management  PCP: Barbaraann Boys, MD  Type: Established Patient  Setting: Ambulatory outpatient    Location: Office  Delivery: Face-to-face     HPI  [name], a 62 y.o. year old male, is here today because of his Lumbosacral facet joint syndrome [M47.817]. Jonathan Stephens primary complain today is No chief complaint on file.  Pertinent problems: Jonathan Stephens has Lumbar scoliosis; Intervertebral disc disorder with radiculopathy of lumbar region; Neuropathic pain of feet (Bilateral); Degenerative lumbar spinal stenosis; Chronic pain syndrome; Abnormal MRI, lumbar spine (02/28/2017 & 09/22/2021); Failed back surgical syndrome; Decreased range of motion of lumbar spine; Chronic low back pain (1ry area of Pain) (Bilateral) (R>L) w/o sciatica; Chronic lower extremity pain (2ry area of Pain) (Bilateral) (L>R); Numbness and tingling of lower extremity (Bilateral); DDD (degenerative disc disease), lumbar; Grade 1 Anterolisthesis of lumbar spine (L4/L5); Grade 1 Retrolisthesis of L3/L4; Greater trochanteric bursitis of hip (Right); Lumbosacral facet joint syndrome (Bilateral); Lumbar facet arthropathy (Multilevel) (Bilateral); Lumbar facet joint pain; Osteoarthritis of facet joint of lumbar spine; Spondylosis without myelopathy or radiculopathy, lumbosacral region; Discogenic low  back pain; Chronic hip pain (Right); Chronic sacroiliac joint pain (Right); History of total hip replacement (Left); Burning pain (feet); and Fixation hardware in spine on their pertinent problem list. Pain Assessment: Severity of   is reported as a  /10. Location:    / . Onset:  . Quality:  . Timing:  . Modifying factor(s):  Marland Kitchen Vitals:  vitals were not taken for this visit.  BMI: Estimated body mass index is 28.25 kg/m as calculated from the following:   Height as of 02/20/22: 6\' 2"  (1.88 m).   Weight as of 02/20/22: 220 lb (99.8 kg). Last encounter: 12/21/2021. Last procedure: 02/20/2022.  Reason for encounter: post-procedure evaluation and assessment. ***  Post-procedure evaluation   Procedure: Lumbar Facet, Medial Branch Radiofrequency Ablation (RFA) #1  Laterality: Left (-LT)  Level: L4, L5, and S1 Medial Branch Level(s). These levels will denervate the L5-S1 lumbar facet joints.  Imaging: Fluoroscopy-guided         Anesthesia: Local anesthesia (1-2% Lidocaine) Anxiolysis: None  Sedation: No Sedation  The patient was not able to secure a ride and decided to have the procedure done without sedation.          DOS: 02/20/2022  Performed by: Gaspar Cola, MD  Purpose: Therapeutic/Palliative Indications: Low back pain severe enough to impact quality of life or function. Indications: 1. Lumbosacral facet joint syndrome (Bilateral)   2. Lumbar facet joint pain   3. Lumbar facet arthropathy (Multilevel) (Bilateral)   4. Spondylosis without myelopathy or radiculopathy, lumbosacral region   5. Grade 1 Retrolisthesis of L3/L4   6. 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 Chronic low back pain (1ry area of Pain) (Bilateral) (R>L) w/o sciatica   8. Abnormal MRI, lumbar spine (02/28/2017 & 09/22/2021)    Jonathan Stephens has been dealing with the above chronic pain for  longer than three months and has either failed to respond, was unable to tolerate, or simply did not get enough  benefit from other more conservative therapies including, but not limited to: 1. Over-the-counter medications 2. Anti-inflammatory medications 3. Muscle relaxants 4. Membrane stabilizers 5. Opioids 6. Physical therapy and/or chiropractic manipulation 7. Modalities (Heat, ice, etc.) 8. Invasive techniques such as nerve blocks. Jonathan Stephens has attained more than 50% relief of the pain from a series of diagnostic injections conducted in separate occasions.  Pain Score: Pre-procedure: 7 /10 Post-procedure: 7 /10     Effectiveness:  Initial hour after procedure:   ***. Subsequent 4-6 hours post-procedure:   ***. Analgesia past initial 6 hours:   ***. Ongoing improvement:  Analgesic:  *** Function:    ***    ROM:    ***     Pharmacotherapy Assessment  Analgesic: No chronic opioid analgesics therapy prescribed by our practice. Oxycodone/APAP 5/325, 1 tab p.o. twice daily. MME/day: 15 mg/day   Monitoring: [place] PMP: PDMP reviewed during this encounter.       Pharmacotherapy: No side-effects or adverse reactions reported. Compliance: No problems identified. Effectiveness: Clinically acceptable.  No notes on file  No results found for: "CBDTHCR" No results found for: "D8THCCBX" No results found for: "D9THCCBX"  UDS:  Summary  Date Value Ref Range Status  09/11/2021 Note  Final    Comment:    ==================================================================== Compliance Drug Analysis, Ur ==================================================================== Test                             Result       Flag       Units  Drug Present and Declared for Prescription Verification   Noroxycodone                   869          EXPECTED   ng/mg creat   Noroxymorphone                 39           EXPECTED   ng/mg creat    Noroxycodone and noroxymorphone are expected metabolites of    oxycodone. Noroxymorphone is an expected metabolite of oxymorphone.    Sources of oxycodone and/or  oxymorphone are scheduled prescription    medications.    Gabapentin                     PRESENT      EXPECTED   Mirtazapine                    PRESENT      EXPECTED   Acetaminophen                  PRESENT      EXPECTED  Drug Present not Declared for Prescription Verification   Carboxy-THC                    15           UNEXPECTED ng/mg creat    Carboxy-THC is a metabolite of tetrahydrocannabinol (THC). Source of    THC is most commonly herbal marijuana or marijuana-based products,    but THC is also present in a scheduled prescription medication.    Trace amounts of THC can be present in hemp and cannabidiol (CBD)    products. This test is not intended to  distinguish between delta-9-    tetrahydrocannabinol, the predominant form of THC in most herbal or    marijuana-based products, and delta-8-tetrahydrocannabinol.    Duloxetine                     PRESENT      UNEXPECTED   Trazodone                      PRESENT      UNEXPECTED   1,3 chlorophenyl piperazine    PRESENT      UNEXPECTED    1,3-chlorophenyl piperazine is an expected metabolite of trazodone.    Ibuprofen                      PRESENT      UNEXPECTED  Drug Absent but Declared for Prescription Verification   Oxycodone                      Not Detected UNEXPECTED ng/mg creat    Oxycodone is almost always present in patients taking this drug    consistently.  Absence of oxycodone could be due to lapse of time    since the last dose or unusual pharmacokinetics (rapid metabolism).  ==================================================================== Test                      Result    Flag   Units      Ref Range   Creatinine              164              mg/dL      >=20 ==================================================================== Declared Medications:  The flagging and interpretation on this report are based on the  following declared medications.  Unexpected results may arise from  inaccuracies in the declared  medications.   **Note: The testing scope of this panel includes these medications:   Gabapentin (Neurontin)  Mirtazapine (Remeron)  Oxycodone (Percocet)   **Note: The testing scope of this panel does not include small to  moderate amounts of these reported medications:   Acetaminophen (Percocet)   **Note: The testing scope of this panel does not include the  following reported medications:   Amlodipine (Norvasc)  Atorvastatin (Lipitor)  Fish Oil  Iron  Multivitamin  Simvastatin (Zocor)  Vitamin E ==================================================================== For clinical consultation, please call [phone]. ====================================================================       ROS  Constitutional: Denies any fever or chills Gastrointestinal: No reported hemesis, hematochezia, vomiting, or acute GI distress Musculoskeletal: Denies any acute onset joint swelling, redness, loss of ROM, or weakness Neurological: No reported episodes of acute onset apraxia, aphasia, dysarthria, agnosia, amnesia, paralysis, loss of coordination, or loss of consciousness  Medication Review  DULoxetine, EPINEPHrine, Fish Oil, Potassium Chloride ER, amLODipine, atorvastatin, ferrous sulfate, fluticasone, gabapentin, ibuprofen, multivitamin with minerals, naloxone, oxyCODONE-acetaminophen, sertraline, traZODone, and vitamin E  History Review  Allergy: Mr. Virginia is allergic to bee venom. Drug: Mr. Gills  reports no history of drug use. Alcohol:  reports that he does not currently use alcohol. Tobacco:  reports that he has been smoking e-cigarettes. He has never used smokeless tobacco. Social: Mr. Milewski  reports that he has been smoking e-cigarettes. He has never used smokeless tobacco. He reports that he does not currently use alcohol. He reports that he does not use drugs. Medical:  has a past medical history of Hepatitis. Surgical: Mr. Volner  has a past surgical history that  includes Mohs surgery (03/2018); Total hip arthroplasty (Left, 09/30/2018); Tonsillectomy; Anterior lat lumbar fusion (Left, 12/03/2019); and Lumbar percutaneous pedicle screw 3 level (N/A, 12/03/2019). Family: family history is not on file.  Laboratory Chemistry Profile   Renal Lab Results  Component Value Date   BUN 7 (L) 09/11/2021   CREATININE 0.89 09/11/2021   BCR 8 (L) 09/11/2021   GFRAA >60 09/09/2019   GFRNONAA >60 12/01/2019    Hepatic Lab Results  Component Value Date   AST 15 09/11/2021   ALT 19 12/01/2019   ALBUMIN 4.6 09/11/2021   ALKPHOS 130 (H) 09/11/2021    Electrolytes Lab Results  Component Value Date   NA 143 09/11/2021   K 2.9 (L) 09/11/2021   CL 97 09/11/2021   CALCIUM 9.6 09/11/2021   MG 1.9 09/11/2021    Bone Lab Results  Component Value Date   25OHVITD1 59 09/11/2021   25OHVITD2 <1.0 09/11/2021   25OHVITD3 59 09/11/2021    Inflammation (CRP: Acute Phase) (ESR: Chronic Phase) Lab Results  Component Value Date   CRP 9 09/11/2021   ESRSEDRATE 32 (H) 09/11/2021         Note: Above Lab results reviewed.  Recent Imaging Review  DG PAIN CLINIC C-ARM 1-60 MIN NO REPORT Fluoro was used, but no Radiologist interpretation will be provided.  Please refer to "NOTES" tab for provider progress note. Note: Reviewed        Physical Exam  General appearance: Well nourished, well developed, and well hydrated. In no apparent acute distress Mental status: Alert, oriented x 3 (person, place, & time)       Respiratory: No evidence of acute respiratory distress Eyes: PERLA Vitals: There were no vitals taken for this visit. BMI: Estimated body mass index is 28.25 kg/m as calculated from the following:   Height as of 02/20/22: 6\' 2"  (1.88 m).   Weight as of 02/20/22: 220 lb (99.8 kg). Ideal: Patient weight not recorded  Assessment   Diagnosis Status  1. Lumbosacral facet joint syndrome (Bilateral)   2. Lumbar facet joint pain   3. Chronic low back pain  (1ry area of Pain) (Bilateral) (R>L) w/o sciatica    Controlled Controlled Controlled   Updated Problems: No problems updated.  Plan of Care  Problem-specific:  No problem-specific Assessment & Plan notes found for this encounter.  Mr. OLEG GOGGANS has a current medication list which includes the following long-term medication(s): amlodipine, atorvastatin, duloxetine, ferrous sulfate, fluticasone, gabapentin, potassium chloride er, sertraline, trazodone, and trazodone.  Pharmacotherapy (Medications Ordered): No orders of the defined types were placed in this encounter.  Orders:  No orders of the defined types were placed in this encounter.  Follow-up plan:   No follow-ups on file.      Interventional Therapies  Risk Factors  Considerations:       Extensive Lumbar HARDWARE   Planned  Pending:      Under consideration:   Diagnostic/therapeutic right greater trochanteric bursa injection #1    Completed:   Therapeutic left (L5-S1) lumbar facet RFA x1 (02/20/2022)  Therapeutic right (L5-S1) lumbar facet RFA x1 (02/08/2022) (2wk FU:90/90/30/30)  Diagnostic/therapeutic right greater trochanteric bursa inj. x1 (12/12/2021) (100/100/100/100)  Diagnostic bilateral lumbar facet MBB (L2-S1) x1 (12/12/2021) (8 to 0/10) (100/100/100 x 3 days/LBP:0 LEP:100)  Diagnostic bilateral lumbar facet MBB (L4-S1) x2 (01/11/2022) (7 to R:2/10 L:0/10) (100/100/50/0)    Completed by other providers:   Diagnostic/therapeutic left L2-3 TFESI x1 (03/04/2017) by  Etheleen Mayhew, MD [facility])  [place] left IA hip injection x2 (04/12/2017, 05/08/2017) by Etheleen Mayhew, MD ([place])  [place] left L4-5 and L5-S1 TFESI x3 (05/29/2017, 09/20/2017, 12/12/2018) by Etheleen Mayhew, MD ([place])  Diagnostic/therapeutic right L4-5 and L5-S1 TESI x1 (06/26/2019)  by Etheleen Mayhew, MD ([place])  Therapeutic left total hip  replacement (THR) x1 (09/30/2018) by P.  Dalldorf, MD  Therapeutic left L2-3, L3-4, L4-5 anterior lateral lumbar interbody fusion w/ percutaneous pedicle screw fixation (left) (12/03/2019) by Dr. Erline Levine     Therapeutic  Palliative (PRN) options:   None established         Recent Visits Date Type Provider Dept  02/20/22 Procedure visit Milinda Pointer, MD [facility]  02/08/22 Procedure visit Milinda Pointer, MD [facility]  01/11/22 Procedure visit Milinda Pointer, MD [facility]  Showing recent visits within past 90 days and meeting all other requirements Future Appointments Date Type Provider Dept  04/03/22 Appointment Milinda Pointer, MD [facility]  Showing future appointments within next 90 days and meeting all other requirements  I discussed the assessment and treatment plan with the patient. The patient was provided an opportunity to ask questions and all were answered. The patient agreed with the plan and demonstrated an understanding of the instructions.  Patient advised to call back or seek an in-person evaluation if the symptoms or condition worsens.  Duration of encounter: *** minutes.  Total time on encounter, as per AMA guidelines included both the face-to-face and non-face-to-face time personally spent by the physician and/or other qualified health care professional(s) on the day of the encounter (includes time in activities that require the physician or other qualified health care professional and does not include time in activities normally performed by clinical staff). Physician's time may include the following activities when performed: Preparing to see the patient (e.g., pre-charting review of records, searching for previously ordered imaging, lab work, and nerve conduction tests) Review of prior analgesic pharmacotherapies. Reviewing PMP Interpreting ordered tests (e.g., lab work, imaging, nerve conduction  tests) Performing post-procedure evaluations, including interpretation of diagnostic procedures Obtaining and/or reviewing separately obtained history Performing a medically appropriate examination and/or evaluation Counseling and educating the patient/family/caregiver Ordering medications, tests, or procedures Referring and communicating with other health care professionals (when not separately reported) Documenting clinical information in the electronic or other health record Independently interpreting results (not separately reported) and communicating results to the patient/ family/caregiver Care coordination (not separately reported)  Note by: Gaspar Cola, MD Date: 04/03/2022; Time: 11:11 AM

## 2022-04-03 ENCOUNTER — Ambulatory Visit: Payer: Federal, State, Local not specified - PPO | Attending: Pain Medicine | Admitting: Pain Medicine

## 2022-04-03 ENCOUNTER — Encounter: Payer: Self-pay | Admitting: Pain Medicine

## 2022-04-03 VITALS — BP 123/69 | HR 71 | Temp 97.5°F | Resp 18 | Ht 74.0 in | Wt 220.0 lb

## 2022-04-03 DIAGNOSIS — M79605 Pain in left leg: Secondary | ICD-10-CM | POA: Insufficient documentation

## 2022-04-03 DIAGNOSIS — M545 Low back pain, unspecified: Secondary | ICD-10-CM | POA: Diagnosis not present

## 2022-04-03 DIAGNOSIS — G8929 Other chronic pain: Secondary | ICD-10-CM | POA: Diagnosis present

## 2022-04-03 DIAGNOSIS — M5459 Other low back pain: Secondary | ICD-10-CM | POA: Diagnosis not present

## 2022-04-03 DIAGNOSIS — R937 Abnormal findings on diagnostic imaging of other parts of musculoskeletal system: Secondary | ICD-10-CM | POA: Insufficient documentation

## 2022-04-03 DIAGNOSIS — M79604 Pain in right leg: Secondary | ICD-10-CM | POA: Diagnosis present

## 2022-04-03 DIAGNOSIS — M961 Postlaminectomy syndrome, not elsewhere classified: Secondary | ICD-10-CM | POA: Insufficient documentation

## 2022-04-03 DIAGNOSIS — M47817 Spondylosis without myelopathy or radiculopathy, lumbosacral region: Secondary | ICD-10-CM | POA: Diagnosis not present

## 2022-04-03 DIAGNOSIS — F4312 Post-traumatic stress disorder, chronic: Secondary | ICD-10-CM | POA: Insufficient documentation

## 2022-04-03 DIAGNOSIS — Z9149 Other personal history of psychological trauma, not elsewhere classified: Secondary | ICD-10-CM | POA: Insufficient documentation

## 2022-04-03 NOTE — Patient Instructions (Addendum)
____________________________________________________________________________________________  General Risks and Possible Complications  Patient Responsibilities: It is important that you read this as it is part of your informed consent. It is our duty to inform you of the risks and possible complications associated with treatments offered to you. It is your responsibility as a patient to read this and to ask questions about anything that is not clear or that you believe was not covered in this document.  Patient's Rights: You have the right to refuse treatment. You also have the right to change your mind, even after initially having agreed to have the treatment done. However, under this last option, if you wait until the last second to change your mind, you may be charged for the materials used up to that point.  Introduction: Medicine is not an exact science. Everything in Medicine, including the lack of treatment(s), carries the potential for danger, harm, or loss (which is by definition: Risk). In Medicine, a complication is a secondary problem, condition, or disease that can aggravate an already existing one. All treatments carry the risk of possible complications. The fact that a side effects or complications occurs, does not imply that the treatment was conducted incorrectly. It must be clearly understood that these can happen even when everything is done following the highest safety standards.  No treatment: You can choose not to proceed with the proposed treatment alternative. The "PRO(s)" would include: avoiding the risk of complications associated with the therapy. The "CON(s)" would include: not getting any of the treatment benefits. These benefits fall under one of three categories: diagnostic; therapeutic; and/or palliative. Diagnostic benefits include: getting information which can ultimately lead to improvement of the disease or symptom(s). Therapeutic benefits are those associated with  the successful treatment of the disease. Finally, palliative benefits are those related to the decrease of the primary symptoms, without necessarily curing the condition (example: decreasing the pain from a flare-up of a chronic condition, such as incurable terminal cancer).  General Risks and Complications: These are associated to most interventional treatments. They can occur alone, or in combination. They fall under one of the following six (6) categories: no benefit or worsening of symptoms; bleeding; infection; nerve damage; allergic reactions; and/or death. No benefits or worsening of symptoms: In Medicine there are no guarantees, only probabilities. No healthcare provider can ever guarantee that a medical treatment will work, they can only state the probability that it may. Furthermore, there is always the possibility that the condition may worsen, either directly, or indirectly, as a consequence of the treatment. Bleeding: This is more common if the patient is taking a blood thinner, either prescription or over the counter (example: Goody Powders, Fish oil, Aspirin, Garlic, etc.), or if suffering a condition associated with impaired coagulation (example: Hemophilia, cirrhosis of the liver, low platelet counts, etc.). However, even if you do not have one on these, it can still happen. If you have any of these conditions, or take one of these drugs, make sure to notify your treating physician. Infection: This is more common in patients with a compromised immune system, either due to disease (example: diabetes, cancer, human immunodeficiency virus [HIV], etc.), or due to medications or treatments (example: therapies used to treat cancer and rheumatological diseases). However, even if you do not have one on these, it can still happen. If you have any of these conditions, or take one of these drugs, make sure to notify your treating physician. Nerve Damage: This is more common when the treatment is an    invasive one, but it can also happen with the use of medications, such as those used in the treatment of cancer. The damage can occur to small secondary nerves, or to large primary ones, such as those in the spinal cord and brain. This damage may be temporary or permanent and it may lead to impairments that can range from temporary numbness to permanent paralysis and/or brain death. Allergic Reactions: Any time a substance or material comes in contact with our body, there is the possibility of an allergic reaction. These can range from a mild skin rash (contact dermatitis) to a severe systemic reaction (anaphylactic reaction), which can result in death. Death: In general, any medical intervention can result in death, most of the time due to an unforeseen complication. ____________________________________________________________________________________________   Epidural Steroid Injection Patient Information  Description: The epidural space surrounds the nerves as they exit the spinal cord.  In some patients, the nerves can be compressed and inflamed by a bulging disc or a tight spinal canal (spinal stenosis).  By injecting steroids into the epidural space, we can bring irritated nerves into direct contact with a potentially helpful medication.  These steroids act directly on the irritated nerves and can reduce swelling and inflammation which often leads to decreased pain.  Epidural steroids may be injected anywhere along the spine and from the neck to the low back depending upon the location of your pain.   After numbing the skin with local anesthetic (like Novocaine), a small needle is passed into the epidural space slowly.  You may experience a sensation of pressure while this is being done.  The entire block usually last less than 10 minutes.  Conditions which may be treated by epidural steroids:  Low back and leg pain Neck and arm pain Spinal stenosis Post-laminectomy syndrome Herpes zoster  (shingles) pain Pain from compression fractures  Preparation for the injection:  Do not eat any solid food or dairy products within 8 hours of your appointment.  You may drink clear liquids up to 3 hours before appointment.  Clear liquids include water, black coffee, juice or soda.  No milk or cream please. You may take your regular medication, including pain medications, with a sip of water before your appointment  Diabetics should hold regular insulin (if taken separately) and take 1/2 normal NPH dos the morning of the procedure.  Carry some sugar containing items with you to your appointment. A driver must accompany you and be prepared to drive you home after your procedure.  Bring all your current medications with your. An IV may be inserted and sedation may be given at the discretion of the physician.   A blood pressure cuff, EKG and other monitors will often be applied during the procedure.  Some patients may need to have extra oxygen administered for a short period. You will be asked to provide medical information, including your allergies, prior to the procedure.  We must know immediately if you are taking blood thinners (like Coumadin/Warfarin)  Or if you are allergic to IV iodine contrast (dye). We must know if you could possible be pregnant.  Possible side-effects: Bleeding from needle site Infection (rare, may require surgery) Nerve injury (rare) Numbness & tingling (temporary) Difficulty urinating (rare, temporary) Spinal headache ( a headache worse with upright posture) Light -headedness (temporary) Pain at injection site (several days) Decreased blood pressure (temporary) Weakness in arm/leg (temporary) Pressure sensation in back/neck (temporary)  Call if you experience: Fever/chills associated with headache or increased back/neck pain.   Headache worsened by an upright position. New onset weakness or numbness of an extremity below the injection site Hives or difficulty  breathing (go to the emergency room) Inflammation or drainage at the infection site Severe back/neck pain Any new symptoms which are concerning to you  Please note:  Although the local anesthetic injected can often make your back or neck feel good for several hours after the injection, the pain will likely return.  It takes 3-7 days for steroids to work in the epidural space.  You may not notice any pain relief for at least that one week.  If effective, we will often do a series of three injections spaced 3-6 weeks apart to maximally decrease your pain.  After the initial series, we generally will wait several months before considering a repeat injection of the same type.  If you have any questions, please call [phone] [facility] ______________________________________________________________________  Procedure instructions  Do not eat or drink fluids (other than water) for 6 hours before your procedure  No water for 2 hours before your procedure  Take your blood pressure medicine with a sip of water  Arrive 30 minutes before your appointment  Carefully read the "Preparing for your procedure" detailed instructions  If you have questions call us at [phone]  _____________________________________________________________________    ______________________________________________________________________  Preparing for your procedure  Appointments: If you think you may not be able to keep your appointment, call 24-48 hours in advance to cancel. We need time to make it available to others.  During your procedure appointment there will be: No Prescription Refills. No disability issues to discussed. No medication changes or discussions.  Instructions: Food intake: Avoid eating anything solid for at least 8 hours prior to your procedure. Clear liquid intake: You may take clear liquids such as water up to 2 hours prior to your  procedure. (No carbonated drinks. No soda.) Transportation: Unless otherwise stated by your physician, bring a driver. Morning Medicines: Except for blood thinners, take all of your other morning medications with a sip of water. Make sure to take your heart and blood pressure medicines. If your blood pressure's lower number is above 100, the case will be rescheduled. Blood thinners: Make sure to stop your blood thinners as instructed.  If you take a blood thinner, but were not instructed to stop it, call our office [phone] and ask to talk to a nurse. Not stopping a blood thinner prior to certain procedures could lead to serious complications. Diabetics on insulin: Notify the staff so that you can be scheduled 1st case in the morning. If your diabetes requires high dose insulin, take only  of your normal insulin dose the morning of the procedure and notify the staff that you have done so. Preventing infections: Shower with an antibacterial soap the morning of your procedure.  Build-up your immune system: Take 1000 mg of Vitamin C with every meal (3 times a day) the day prior to your procedure. Antibiotics: Inform the nursing staff if you are taking any antibiotics or if you have any conditions that may require antibiotics prior to procedures. (Example: recent joint implants)   Pregnancy: If you are pregnant make sure to notify the nursing staff. Not doing so may result in injury to the fetus, including death.  Sickness: If you have a cold, fever, or any active infections, call and cancel or reschedule your procedure. Receiving steroids while having an infection may result in complications. Arrival: You  must be in the facility at least 30 minutes prior to your scheduled procedure. Tardiness: Your scheduled time is also the cutoff time. If you do not arrive at least 15 minutes prior to your procedure, you will be rescheduled.  Children: Do not bring any children with you. Make arrangements to keep  them home. Dress appropriately: There is always a possibility that your clothing may get soiled. Avoid long dresses. Valuables: Do not bring any jewelry or valuables.  Reasons to call and reschedule or cancel your procedure: (Following these recommendations will minimize the risk of a serious complication.) Surgeries: Avoid having procedures within 2 weeks of any surgery. (Avoid for 2 weeks before or after any surgery). Flu Shots: Avoid having procedures within 2 weeks of a flu shots or . (Avoid for 2 weeks before or after immunizations). Barium: Avoid having a procedure within 7-10 days after having had a radiological study involving the use of radiological contrast. (Myelograms, Barium swallow or enema study). Heart attacks: Avoid any elective procedures or surgeries for the initial 6 months after a "Myocardial Infarction" (Heart Attack). Blood thinners: It is imperative that you stop these medications before procedures. Let us know if you if you take any blood thinner.  Infection: Avoid procedures during or within two weeks of an infection (including chest colds or gastrointestinal problems). Symptoms associated with infections include: Localized redness, fever, chills, night sweats or profuse sweating, burning sensation when voiding, cough, congestion, stuffiness, runny nose, sore throat, diarrhea, nausea, vomiting, cold or Flu symptoms, recent or current infections. It is specially important if the infection is over the area that we intend to treat. Heart and lung problems: Symptoms that may suggest an active cardiopulmonary problem include: cough, chest pain, breathing difficulties or shortness of breath, dizziness, ankle swelling, uncontrolled high or unusually low blood pressure, and/or palpitations. If you are experiencing any of these symptoms, cancel your procedure and contact your primary care physician for an evaluation.  Remember:  Regular Business hours are:  Monday to Thursday 8:00 AM  to 4:00 PM  Provider's Schedule: Milinda Pointer, MD:  Procedure days: Tuesday and Thursday 7:30 AM to 4:00 PM  Gillis Santa, MD:  Procedure days: Monday and Wednesday 7:30 AM to 4:00 PM  ______________________________________________________________________    ____________________________________________________________________________________________  General Risks and Possible Complications  Patient Responsibilities: It is important that you read this as it is part of your informed consent. It is our duty to inform you of the risks and possible complications associated with treatments offered to you. It is your responsibility as a patient to read this and to ask questions about anything that is not clear or that you believe was not covered in this document.  Patient's Rights: You have the right to refuse treatment. You also have the right to change your mind, even after initially having agreed to have the treatment done. However, under this last option, if you wait until the last second to change your mind, you may be charged for the materials used up to that point.  Introduction: Medicine is not an Chief Strategy Officer. Everything in Medicine, including the lack of treatment(s), carries the potential for danger, harm, or loss (which is by definition: Risk). In Medicine, a complication is a secondary problem, condition, or disease that can aggravate an already existing one. All treatments carry the risk of possible complications. The fact that a side effects or complications occurs, does not imply that the treatment was conducted incorrectly. It must be clearly understood that these can  happen even when everything is done following the highest safety standards.  No treatment: You can choose not to proceed with the proposed treatment alternative. The "PRO(s)" would include: avoiding the risk of complications associated with the therapy. The "CON(s)" would include: not getting any of the treatment  benefits. These benefits fall under one of three categories: diagnostic; therapeutic; and/or palliative. Diagnostic benefits include: getting information which can ultimately lead to improvement of the disease or symptom(s). Therapeutic benefits are those associated with the successful treatment of the disease. Finally, palliative benefits are those related to the decrease of the primary symptoms, without necessarily curing the condition (example: decreasing the pain from a flare-up of a chronic condition, such as incurable terminal cancer).  General Risks and Complications: These are associated to most interventional treatments. They can occur alone, or in combination. They fall under one of the following six (6) categories: no benefit or worsening of symptoms; bleeding; infection; nerve damage; allergic reactions; and/or death. No benefits or worsening of symptoms: In Medicine there are no guarantees, only probabilities. No healthcare provider can ever guarantee that a medical treatment will work, they can only state the probability that it may. Furthermore, there is always the possibility that the condition may worsen, either directly, or indirectly, as a consequence of the treatment. Bleeding: This is more common if the patient is taking a blood thinner, either prescription or over the counter (example: Goody Powders, Fish oil, Aspirin, Garlic, etc.), or if suffering a condition associated with impaired coagulation (example: Hemophilia, cirrhosis of the liver, low platelet counts, etc.). However, even if you do not have one on these, it can still happen. If you have any of these conditions, or take one of these drugs, make sure to notify your treating physician. Infection: This is more common in patients with a compromised immune system, either due to disease (example: diabetes, cancer, human immunodeficiency virus [HIV], etc.), or due to medications or treatments (example: therapies used to treat cancer  and rheumatological diseases). However, even if you do not have one on these, it can still happen. If you have any of these conditions, or take one of these drugs, make sure to notify your treating physician. Nerve Damage: This is more common when the treatment is an invasive one, but it can also happen with the use of medications, such as those used in the treatment of cancer. The damage can occur to small secondary nerves, or to large primary ones, such as those in the spinal cord and brain. This damage may be temporary or permanent and it may lead to impairments that can range from temporary numbness to permanent paralysis and/or brain death. Allergic Reactions: Any time a substance or material comes in contact with our body, there is the possibility of an allergic reaction. These can range from a mild skin rash (contact dermatitis) to a severe systemic reaction (anaphylactic reaction), which can result in death. Death: In general, any medical intervention can result in death, most of the time due to an unforeseen complication. ____________________________________________________________________________________________

## 2022-04-03 NOTE — Progress Notes (Signed)
Safety precautions to be maintained throughout the outpatient stay will include: orient to surroundings, keep bed in low position, maintain call bell within reach at all times, provide assistance with transfer out of bed and ambulation.  

## 2022-06-25 NOTE — Progress Notes (Unsigned)
PROVIDER NOTE: Interpretation of information contained herein should be left to medically-trained personnel. Specific patient instructions are provided elsewhere under "Patient Instructions" section of medical record. This document was created in part using STT-dictation technology, any transcriptional errors that may result from this process are unintentional.  Patient: Jonathan Stephens Type: Established DOB: 1960/05/04 MRN: [id number] PCP: Orene Desanctis, MD  Service: Procedure DOS: 06/26/2022 Setting: Ambulatory Location: Ambulatory outpatient facility Delivery: Face-to-face Provider: Oswaldo Done, MD Specialty: Interventional Pain Management Specialty designation: 09 Location: Outpatient facility Ref. Prov.: Delano Metz, MD       Interventional Therapy   Procedure: Caudal Epidural Steroid Injection + Epidurogram   #1  Laterality: Midline aiming right Level: Sacrococcygeal ligament  Imaging: Fluoroscopy-guided         Anesthesia: Local anesthesia (1-2% Lidocaine) Anxiolysis: None                 Sedation: No Sedation                       DOS: 06/26/2022  Performed by: Oswaldo Done, MD  Purpose: Diagnostic/Therapeutic Indications: Low back and lower extremity pain severe enough to impact quality of life or function. Rationale (medical necessity): procedure needed and proper for the diagnosis and/or treatment of Jonathan Stephens's medical symptoms and needs. 1. 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 Chronic lower extremity pain (2ry area of Pain) (Bilateral) (L>R)   3. Failed back surgical syndrome   4. DDD (degenerative disc disease), lumbar   5. Decreased range of motion of lumbar spine   6. 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 Grade 1 Retrolisthesis of L3/L4   8. Intervertebral disc disorder with radiculopathy of lumbar region    NAS-11 Pain score:   Pre-procedure: 7 /10   Post-procedure: 7 /10     Target:  Lumbosacral epidural canal  Location: Epidural space  Region: Caudal canal Approach: Percutaneous  Type of procedure: Epidural block  Position  Prep  Materials:  Position: Prone  Prep solution: DuraPrep (Iodine Povacrylex [0.7% available iodine] and Isopropyl Alcohol, 74% w/w) Prep Area: Entire posterior lumbosacral area  Materials:  Tray: Epidural          Needle(s):  Type: Epidural  Gauge (G): 17  Length: 3.5-in  Qty: 1   Pre-op H&P Assessment:  Jonathan Stephens is a 62 y.o. (year old), male patient, seen today for interventional treatment. 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 Jonathan Stephens has a current medication list which includes the following prescription(s): amlodipine, atorvastatin, duloxetine, epinephrine, ferrous sulfate, fluticasone, gabapentin, ibuprofen, multivitamin with minerals, naloxone, fish oil, oxycodone-acetaminophen, potassium chloride er, sertraline, trazodone, vitamin e, and trazodone. His primarily concern today is the Back Pain (lower)  Initial Vital Signs:  Pulse/HCG Rate: 68ECG Heart Rate: 65 Temp: 97.7 F (36.5 C) Resp: 18 BP: 129/70 SpO2: 96 %  BMI: Estimated body mass index is 28.25 kg/m as calculated from the following:   Height as of this encounter: 6\' 2"  (1.88 m).   Weight as of this encounter: 220 lb (99.8 kg).  Risk Assessment: Allergies: Reviewed. He is allergic to bee venom.  Allergy Precautions: None required Coagulopathies: Reviewed. None identified.  Blood-thinner therapy: None at this time Active Infection(s): Reviewed. None identified. Jonathan Stephens is afebrile  Site  Confirmation: Jonathan Stephens was asked to confirm the procedure and laterality before marking the site Procedure checklist: Completed Consent: Before the procedure and under the  influence of no sedative(s), amnesic(s), or anxiolytics, the patient was informed of the treatment options, risks and possible complications. To fulfill our ethical and legal obligations, as recommended by the American Medical Association's Code of Ethics, I have informed the patient of my clinical impression; the nature and purpose of the treatment or procedure; the risks, benefits, and possible complications of the intervention; the alternatives, including doing nothing; the risk(s) and benefit(s) of the alternative treatment(s) or procedure(s); and the risk(s) and benefit(s) of doing nothing. The patient was provided information about the general risks and possible complications associated with the procedure. These may include, but are not limited to: failure to achieve desired goals, infection, bleeding, organ or nerve damage, allergic reactions, paralysis, and death. In addition, the patient was informed of those risks and complications associated to Spine-related procedures, such as failure to decrease pain; infection (i.e.: Meningitis, epidural or intraspinal abscess); bleeding (i.e.: epidural hematoma, subarachnoid hemorrhage, or any other type of intraspinal or peri-dural bleeding); organ or nerve damage (i.e.: Any type of peripheral nerve, nerve root, or spinal cord injury) with subsequent damage to sensory, motor, and/or autonomic systems, resulting in permanent pain, numbness, and/or weakness of one or several areas of the body; allergic reactions; (i.e.: anaphylactic reaction); and/or death. Furthermore, the patient was informed of those risks and complications associated with the medications. These include, but are not limited to: allergic reactions (i.e.: anaphylactic or anaphylactoid reaction(s)); adrenal axis suppression; blood sugar elevation that in diabetics may result in ketoacidosis or comma; water retention that in patients with history of congestive heart failure may result in shortness of  breath, pulmonary edema, and decompensation with resultant heart failure; weight gain; swelling or edema; medication-induced neural toxicity; particulate matter embolism and blood vessel occlusion with resultant organ, and/or nervous system infarction; and/or aseptic necrosis of one or more joints. Finally, the patient was informed that Medicine is not an exact science; therefore, there is also the possibility of unforeseen or unpredictable risks and/or possible complications that may result in a catastrophic outcome. The patient indicated having understood very clearly. We have given the patient no guarantees and we have made no promises. Enough time was given to the patient to ask questions, all of which were answered to the patient's satisfaction. Mr. Colucci has indicated that he wanted to continue with the procedure. Attestation: I, the ordering provider, attest that I have discussed with the patient the benefits, risks, side-effects, alternatives, likelihood of achieving goals, and potential problems during recovery for the procedure that I have provided informed consent. Date  Time: 06/26/2022  9:10 AM   Pre-Procedure Preparation:  Monitoring: As per clinic protocol. Respiration, ETCO2, SpO2, BP, heart rate and rhythm monitor placed and checked for adequate function Safety Precautions: Patient was assessed for positional comfort and pressure points before starting the procedure. Time-out: I initiated and conducted the "Time-out" before starting the procedure, as per protocol. The patient was asked to participate by confirming the accuracy of the "Time Out" information. Verification of the correct person, site, and procedure were performed and confirmed by me, the nursing staff, and the patient. "Time-out" conducted as per Joint Commission's Universal Protocol (UP.01.01.01). Time: 1015 Start Time: 1015 hrs.  Description  Narrative of Procedure:          Start Time: 1015 hrs.  Technical  description of procedure:  Safety Precautions:  Aspiration looking for blood return was conducted prior to all injections. At no point did we inject any substances, as a needle was being advanced. No attempts were made at seeking any paresthesias. Safe injection practices and needle disposal techniques used. Medications properly checked for expiration dates. SDV (single dose vial) medications used. Description of the Procedure: Protocol guidelines were followed. The patient was placed in position over the fluoroscopy table. The target area was identified and the area prepped in the usual manner. Skin & deeper tissues infiltrated with local anesthetic. Appropriate amount of time allowed to pass for local anesthetics to take effect. The procedure needle was then advanced to the target area. Proper needle placement secured. Negative aspiration confirmed.  Solution injected in intermittent fashion, asking for systemic symptoms every 0.5cc of injectate. The needle/catheter were removed and the area cleansed, making sure to leave some of the prepping solution back to take advantage of its long term bactericidal properties.  Vitals:   06/26/22 0917 06/26/22 1014 06/26/22 1020  BP: 129/70 123/80 127/75  Pulse: 68    Resp:  18 16  Temp: 97.7 F (36.5 C)    SpO2: 96% 96% 95%  Weight: 220 lb (99.8 kg)    Height: 6\' 2"  (1.88 m)       End Time: 1020 hrs.  Imaging Guidance (Spinal):          Type of Imaging Technique: Fluoroscopy Guidance (Spinal) Indication(s): Assistance in needle guidance and placement for procedures requiring needle placement in or near specific anatomical locations not easily accessible without such assistance. Exposure Time: Please see nurses notes. Contrast: Before injecting any contrast, we confirmed that the patient did not have an allergy to iodine, shellfish, or radiological contrast. Once satisfactory needle placement was completed at the desired level, radiological contrast was  injected. Contrast injected under live fluoroscopy. No contrast complications. See chart for type and volume of contrast used. Fluoroscopic Guidance: I was personally present during the use of fluoroscopy. "Tunnel Vision Technique" used to obtain the best possible view of the target area. Parallax error corrected before commencing the procedure. "Direction-depth-direction" technique used to introduce the needle under continuous pulsed fluoroscopy. Once target was reached, antero-posterior, oblique, and lateral fluoroscopic projection used confirm needle placement in all planes. Images permanently stored in EMR. Interpretation: I personally interpreted the imaging intraoperatively. Adequate needle placement confirmed in multiple planes. Appropriate spread of contrast into desired area was observed. No evidence of afferent or efferent intravascular uptake. No intrathecal or subarachnoid spread observed. Permanent images saved into the patient's record.  Diagnostic Epidurogram:  Contrast:  Type: Non-ionic, water soluble, hypoallergenic, myelogram-compatible, radiological contrast used. Please see orders and nurses note for specific choice of contrast. Volume: Please see nurses note for injected volume.  Observations:  Spinal Alignment: Adequate       Vertebral body: Intact Lamina: Surgical changes observed Disc: Prosthetic disc Facet: Arthritic changes        Hardware: Intervertebral disc spacer  Spread: Appropriate epidural spread of contrast Anterior: Adequate Posterior: Defect identified on the right side at the level of L5-S1 Superior (cephalad): Unilateral cut-off at surgical defect: Inferior (caudad): Adequate Right lateral: Defect identified at the L5-S1 level Left lateral: Adequate Plica medialis dorsalis: Medially aligned Nerve root(s): ill-defined over the L5-S1 region Epidural extravasation: None observed Intrathecal: No intrathecal spread identified Subarachnoid: No subarachnoid  spread pattern observed Vascular: No evidence of afferent or efferent intravascular uptake  Impression: Technically successful epidurogram. 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  Post-operative Assessment:  Post-procedure Vital Signs:  Pulse/HCG Rate: 6864 Temp: 97.7 F (36.5 C) Resp: 16 BP: 127/75 SpO2: 95 %  EBL: None  Complications: No immediate post-treatment complications observed by team, or reported by patient.  Note: The patient tolerated the entire procedure well. A repeat set of vitals were taken after the procedure and the patient was kept under observation following institutional policy, for this type of procedure. Post-procedural neurological assessment was performed, showing return to baseline, prior to discharge. The patient was provided with post-procedure discharge instructions, including a section on how to identify potential problems. Should any problems arise concerning this procedure, the patient was given instructions to immediately contact us, at any time, without hesitation. In any case, we plan to contact the patient by telephone for a follow-up status report regarding this interventional procedure.  Comments:  No additional relevant information.  Plan of Care (POC)  Orders:  Orders Placed This Encounter  Procedures   Caudal Epidural Injection    Scheduling Instructions:     Laterality: Right-sided     Level(s): Sacrococcygeal canal (Tailbone area)     Sedation: Patient's choice     Timeframe: Today    Order Specific Question:   Where will this procedure be performed?    Answer:   [facility]   DG [facility] C-ARM 1-60 MIN NO REPORT    Intraoperative interpretation by procedural physician at [facility].    Standing Status:   Standing    Number of Occurrences:   1    Order Specific Question:   Reason for exam:    Answer:   Assistance in needle  guidance and placement for procedures requiring needle placement in or near specific anatomical locations not easily accessible without such assistance.   Informed Consent Details: Physician/Practitioner Attestation; Transcribe to consent form and obtain patient signature    Nursing Order: Transcribe to consent form and obtain patient signature. Note: Always confirm laterality of pain with Mr. Wiegman, before procedure.    Order Specific Question:   Physician/Practitioner attestation of informed consent for procedure/surgical case    Answer:   I, the physician/practitioner, attest that I have discussed with the patient the benefits, risks, side effects, alternatives, likelihood of achieving goals and potential problems during recovery for the procedure that I have provided informed consent.    Order Specific Question:   Procedure    Answer:   Caudal epidural steroid injection    Order Specific Question:   Physician/Practitioner performing the procedure    Answer:   Mykenzi Vanzile A. Laban Emperor, MD    Order Specific Question:   Indication/Reason    Answer:   Low back pain and lower extremity pain secondary to lumbosacral radiculitis   Provide equipment / supplies at bedside    Procedural tray: Epidural Tray (Disposable  single use) Skin infiltration needle: Regular 1.5-in, 25-G, (x1) Block needle size: Regular standard Catheter: No catheter required    Standing Status:   Standing    Number of Occurrences:   1    Order Specific Question:   Specify    Answer:   Epidural Tray   Chronic Opioid Analgesic:  No chronic opioid analgesics therapy prescribed by our practice. Oxycodone/APAP 5/325, 1 tab p.o. twice daily. (09/11/2021) UDS (+) Carboxy-THC. MME/day: 15 mg/day   Medications ordered for procedure: Meds ordered this encounter  Medications   iohexol (OMNIPAQUE) 180 MG/ML injection 10 mL    Must be  Myelogram-compatible. If not available, you may substitute with a water-soluble, non-ionic,  hypoallergenic, myelogram-compatible radiological contrast medium.   lidocaine (XYLOCAINE) 2 % (with pres) injection 400 mg   pentafluoroprop-tetrafluoroeth (GEBAUERS) aerosol   sodium chloride flush (NS) 0.9 % injection 2 mL   ropivacaine (PF) 2 mg/mL (0.2%) (NAROPIN) injection 2 mL   triamcinolone acetonide (KENALOG-40) injection 40 mg   Medications administered: We administered iohexol, lidocaine, pentafluoroprop-tetrafluoroeth, sodium chloride flush, ropivacaine (PF) 2 mg/mL (0.2%), and triamcinolone acetonide.  See the medical record for exact dosing, route, and time of administration.  Follow-up plan:   Return in about 2 weeks (around 07/10/2022) for Proc-day (T,Th), (Face2F), (PPE).       Interventional Therapies  Risk Factors  Considerations:       Extensive Lumbar HARDWARE   Planned  Pending:   Diagnostic/therapeutic right caudal ESI #1 + epidurogram    Under consideration:   Diagnostic/therapeutic right caudal ESI #1 + epidurogram  Therapeutic right RACZ procedure #1  Diagnostic/therapeutic right greater trochanteric bursa injection #1    Completed:   Therapeutic left (L5-S1) lumbar facet RFA x1 (02/20/2022)  Therapeutic right (L5-S1) lumbar facet RFA x1 (02/08/2022) (2wk FU:90/90/30/30)  Diagnostic/therapeutic right greater trochanteric bursa inj. x1 (12/12/2021) (100/100/100/100)  Diagnostic bilateral lumbar facet MBB (L2-S1) x1 (12/12/2021) (8 to 0/10) (100/100/100 x 3 days/LBP:0 LEP:100)  Diagnostic bilateral lumbar facet MBB (L4-S1) x2 (01/11/2022) (7 to R:2/10 L:0/10) (100/100/50/0)    Completed by other providers:   Diagnostic/therapeutic left L2-3 TFESI x1 (03/04/2017) by Holland Commons, MD [facility])  Diagnostic/therapeutic left IA hip injection x2 (04/12/2017, 05/08/2017) by Holland Commons, MD ([facility])  Diagnostic/therapeutic left L4-5 and L5-S1 TFESI x3 (05/29/2017, 09/20/2017, 12/12/2018) by Holland Commons, MD ([facility])   Diagnostic/therapeutic right L4-5 and L5-S1 TESI x1 (06/26/2019)  by Holland Commons, MD [facility])  Therapeutic left total hip replacement (THR) x1 (09/30/2018) by P.  Dalldorf, MD  Therapeutic left L2-3, L3-4, L4-5 anterior lateral lumbar interbody fusion w/ percutaneous pedicle screw fixation (left) (12/03/2019) by Dr. Maeola Harman     Therapeutic  Palliative (PRN) options:   None established   Pharmacotherapy  No chronic opioid analgesics therapy prescribed by our practice. UDS (09/11/2021) (+) Carboxy-THC        Recent Visits Date Type Provider Dept  04/03/22 Office Visit Delano Metz, MD [facility]  Showing recent visits within past 90 days and meeting all other requirements Today's Visits Date Type Provider Dept  06/26/22 Procedure visit Delano Metz, MD [facility]  Showing today's visits and meeting all other requirements Future Appointments Date Type Provider Dept  07/24/22 Appointment Delano Metz, MD [facility]  Showing future appointments within next 90 days and meeting all other requirements  Disposition: Discharge home  Discharge (Date  Time): 06/26/2022; 1030 hrs.   Primary Care Physician: Orene Desanctis, MD Location: [facility] Note by: Oswaldo Done, MD (TTS technology used. I apologize for any typographical errors that were not detected and corrected.) Date: 06/26/2022; Time: 11:08 AM  Disclaimer:  Medicine is not an Visual merchandiser. The only guarantee in medicine is that nothing is guaranteed. It is important to note that the decision to proceed with this intervention was based on the information collected from the patient. The Data and conclusions were drawn from the patient's questionnaire, the interview, and the physical examination. Because the information was provided in large part by the patient, it cannot be guaranteed  that it has not been purposely or  unconsciously manipulated. Every effort has been made to obtain as much relevant data as possible for this evaluation. It is important to note that the conclusions that lead to this procedure are derived in large part from the available data. Always take into account that the treatment will also be dependent on availability of resources and existing treatment guidelines, considered by other Pain Management Practitioners as being common knowledge and practice, at the time of the intervention. For Medico-Legal purposes, it is also important to point out that variation in procedural techniques and pharmacological choices are the acceptable norm. The indications, contraindications, technique, and results of the above procedure should only be interpreted and judged by a Board-Certified Interventional Pain Specialist with extensive familiarity and expertise in the same exact procedure and technique.

## 2022-06-26 ENCOUNTER — Encounter: Payer: Self-pay | Admitting: Pain Medicine

## 2022-06-26 ENCOUNTER — Ambulatory Visit
Admission: RE | Admit: 2022-06-26 | Discharge: 2022-06-26 | Disposition: A | Discharge status: Home / Self Care | Payer: Federal, State, Local not specified - PPO | Source: Ambulatory Visit | Attending: Pain Medicine | Admitting: Pain Medicine

## 2022-06-26 ENCOUNTER — Ambulatory Visit: Payer: Federal, State, Local not specified - PPO | Attending: Pain Medicine | Admitting: Pain Medicine

## 2022-06-26 VITALS — BP 127/75 | HR 68 | Temp 97.7°F | Resp 16 | Ht 74.0 in | Wt 220.0 lb

## 2022-06-26 DIAGNOSIS — M47817 Spondylosis without myelopathy or radiculopathy, lumbosacral region: Secondary | ICD-10-CM | POA: Diagnosis present

## 2022-06-26 DIAGNOSIS — M961 Postlaminectomy syndrome, not elsewhere classified: Secondary | ICD-10-CM | POA: Diagnosis present

## 2022-06-26 DIAGNOSIS — M5116 Intervertebral disc disorders with radiculopathy, lumbar region: Secondary | ICD-10-CM | POA: Insufficient documentation

## 2022-06-26 DIAGNOSIS — M5442 Lumbago with sciatica, left side: Secondary | ICD-10-CM | POA: Diagnosis present

## 2022-06-26 DIAGNOSIS — M5441 Lumbago with sciatica, right side: Secondary | ICD-10-CM | POA: Diagnosis present

## 2022-06-26 DIAGNOSIS — M4310 Spondylolisthesis, site unspecified: Secondary | ICD-10-CM | POA: Diagnosis present

## 2022-06-26 DIAGNOSIS — G96198 Other disorders of meninges, not elsewhere classified: Secondary | ICD-10-CM | POA: Insufficient documentation

## 2022-06-26 DIAGNOSIS — M79604 Pain in right leg: Secondary | ICD-10-CM | POA: Diagnosis present

## 2022-06-26 DIAGNOSIS — M5386 Other specified dorsopathies, lumbar region: Secondary | ICD-10-CM | POA: Diagnosis present

## 2022-06-26 DIAGNOSIS — M545 Low back pain, unspecified: Secondary | ICD-10-CM | POA: Diagnosis present

## 2022-06-26 DIAGNOSIS — M5136 Other intervertebral disc degeneration, lumbar region: Secondary | ICD-10-CM | POA: Insufficient documentation

## 2022-06-26 DIAGNOSIS — R937 Abnormal findings on diagnostic imaging of other parts of musculoskeletal system: Secondary | ICD-10-CM | POA: Diagnosis present

## 2022-06-26 DIAGNOSIS — G8929 Other chronic pain: Secondary | ICD-10-CM | POA: Diagnosis present

## 2022-06-26 DIAGNOSIS — M5459 Other low back pain: Secondary | ICD-10-CM | POA: Insufficient documentation

## 2022-06-26 DIAGNOSIS — M4316 Spondylolisthesis, lumbar region: Secondary | ICD-10-CM | POA: Insufficient documentation

## 2022-06-26 DIAGNOSIS — M79605 Pain in left leg: Secondary | ICD-10-CM | POA: Insufficient documentation

## 2022-06-26 MED ORDER — SODIUM CHLORIDE 0.9% FLUSH
2.0000 mL | Freq: Once | INTRAVENOUS | Status: AC
  Administered 2022-06-26: 2 mL

## 2022-06-26 MED ORDER — LIDOCAINE HCL 2 % IJ SOLN
20.0000 mL | Freq: Once | INTRAMUSCULAR | Status: AC
  Administered 2022-06-26: 100 mg
  Filled 2022-06-26: qty 40

## 2022-06-26 MED ORDER — TRIAMCINOLONE ACETONIDE 40 MG/ML IJ SUSP
40.0000 mg | Freq: Once | INTRAMUSCULAR | Status: AC
  Administered 2022-06-26: 40 mg
  Filled 2022-06-26: qty 1

## 2022-06-26 MED ORDER — PENTAFLUOROPROP-TETRAFLUOROETH EX AERO
INHALATION_SPRAY | Freq: Once | CUTANEOUS | Status: AC
  Administered 2022-06-26: 30 via TOPICAL
  Filled 2022-06-26: qty 116

## 2022-06-26 MED ORDER — IOHEXOL 180 MG/ML  SOLN
10.0000 mL | Freq: Once | INTRAMUSCULAR | Status: AC
  Administered 2022-06-26: 10 mL via EPIDURAL
  Filled 2022-06-26: qty 20

## 2022-06-26 MED ORDER — ROPIVACAINE HCL 2 MG/ML IJ SOLN
2.0000 mL | Freq: Once | INTRAMUSCULAR | Status: AC
  Administered 2022-06-26: 2 mL via EPIDURAL
  Filled 2022-06-26: qty 20

## 2022-06-26 NOTE — Progress Notes (Signed)
Safety precautions to be maintained throughout the outpatient stay will include: orient to surroundings, keep bed in low position, maintain call bell within reach at all times, provide assistance with transfer out of bed and ambulation.  

## 2022-06-26 NOTE — Patient Instructions (Signed)
Pain Management Discharge Instructions  General Discharge Instructions :  If you need to reach your doctor call: Monday-Friday 8:00 am - 4:00 pm at [phone] or toll free [phone].  After clinic hours [phone] to have operator reach doctor.  Bring all of your medication bottles to all your appointments in the pain clinic.  To cancel or reschedule your appointment with Pain Management please remember to call 24 hours in advance to avoid a fee.  Refer to the educational materials which you have been given on: General Risks, I had my Procedure. Discharge Instructions, Post Sedation.  Post Procedure Instructions:  The drugs you were given will stay in your system until tomorrow, so for the next 24 hours you should not drive, make any legal decisions or drink any alcoholic beverages.  You may eat anything you prefer, but it is better to start with liquids then soups and crackers, and gradually work up to solid foods.  Please notify your doctor immediately if you have any unusual bleeding, trouble breathing or pain that is not related to your normal pain.  Depending on the type of procedure that was done, some parts of your body may feel week and/or numb.  This usually clears up by tonight or the next day.  Walk with the use of an assistive device or accompanied by an adult for the 24 hours.  You may use ice on the affected area for the first 24 hours.  Put ice in a Ziploc bag and cover with a towel and place against area 15 minutes on 15 minutes off.  You may switch to heat after 24 hours.Epidural Steroid Injection Patient Information  Description: The epidural space surrounds the nerves as they exit the spinal cord.  In some patients, the nerves can be compressed and inflamed by a bulging disc or a tight spinal canal (spinal stenosis).  By injecting steroids into the epidural space, we can bring irritated nerves into direct contact with a potentially helpful medication.  These  steroids act directly on the irritated nerves and can reduce swelling and inflammation which often leads to decreased pain.  Epidural steroids may be injected anywhere along the spine and from the neck to the low back depending upon the location of your pain.   After numbing the skin with local anesthetic (like Novocaine), a small needle is passed into the epidural space slowly.  You may experience a sensation of pressure while this is being done.  The entire block usually last less than 10 minutes.  Conditions which may be treated by epidural steroids:  Low back and leg pain Neck and arm pain Spinal stenosis Post-laminectomy syndrome Herpes zoster (shingles) pain Pain from compression fractures  Preparation for the injection:  Do not eat any solid food or dairy products within 8 hours of your appointment.  You may drink clear liquids up to 3 hours before appointment.  Clear liquids include water, black coffee, juice or soda.  No milk or cream please. You may take your regular medication, including pain medications, with a sip of water before your appointment  Diabetics should hold regular insulin (if taken separately) and take 1/2 normal NPH dos the morning of the procedure.  Carry some sugar containing items with you to your appointment. A driver must accompany you and be prepared to drive you home after your procedure.  Bring all your current medications with your. An IV may be inserted and sedation may be given at the discretion of the physician.     A blood pressure cuff, EKG and other monitors will often be applied during the procedure.  Some patients may need to have extra oxygen administered for a short period. You will be asked to provide medical information, including your allergies, prior to the procedure.  We must know immediately if you are taking blood thinners (like Coumadin/Warfarin)  Or if you are allergic to IV iodine contrast (dye). We must know if you could possible be  pregnant.  Possible side-effects: Bleeding from needle site Infection (rare, may require surgery) Nerve injury (rare) Numbness & tingling (temporary) Difficulty urinating (rare, temporary) Spinal headache ( a headache worse with upright posture) Light -headedness (temporary) Pain at injection site (several days) Decreased blood pressure (temporary) Weakness in arm/leg (temporary) Pressure sensation in back/neck (temporary)  Call if you experience: Fever/chills associated with headache or increased back/neck pain. Headache worsened by an upright position. New onset weakness or numbness of an extremity below the injection site Hives or difficulty breathing (go to the emergency room) Inflammation or drainage at the infection site Severe back/neck pain Any new symptoms which are concerning to you  Please note:  Although the local anesthetic injected can often make your back or neck feel good for several hours after the injection, the pain will likely return.  It takes 3-7 days for steroids to work in the epidural space.  You may not notice any pain relief for at least that one week.  If effective, we will often do a series of three injections spaced 3-6 weeks apart to maximally decrease your pain.  After the initial series, we generally will wait several months before considering a repeat injection of the same type.  If you have any questions, please call [phone] [facility] 

## 2022-06-27 ENCOUNTER — Telehealth: Payer: Self-pay

## 2022-06-27 NOTE — Telephone Encounter (Signed)
Post procedure follow up.  Patient states he is doing good.  

## 2022-07-23 NOTE — Progress Notes (Unsigned)
PROVIDER NOTE: Information contained herein reflects review and annotations entered in association with encounter. Interpretation of such information and data should be left to medically-trained personnel. Information provided to patient can be located elsewhere in the medical record under "Patient Instructions". Document created using STT-dictation technology, any transcriptional errors that may result from process are unintentional.    Patient: Jonathan Stephens  Service Category: E/M  Provider: Oswaldo Done, MD  DOB: 14-Jan-1960  DOS: 07/24/2022  Referring Provider: Orene Desanctis, MD  MRN: [id number]  Specialty: Interventional Pain Management  PCP: Orene Desanctis, MD  Type: Established Patient  Setting: Ambulatory outpatient    Location: Office  Delivery: Face-to-face     HPI  [name], a 62 y.o. year old male, is here today because of his No primary diagnosis found.. Jonathan Stephens primary complain today is No chief complaint on file.  Pertinent problems: Jonathan Stephens has Lumbar scoliosis; Intervertebral disc disorder with radiculopathy of lumbar region; Neuropathic pain of feet (Bilateral); Degenerative lumbar spinal stenosis; Chronic pain syndrome; Abnormal MRI, lumbar spine (02/28/2017 & 09/22/2021); Failed back surgical syndrome; Decreased range of motion of lumbar spine; Chronic low back pain (1ry area of Pain) (Bilateral) (R>L) w/o sciatica; Chronic lower extremity pain (2ry area of Pain) (Bilateral) (L>R); Numbness and tingling of lower extremity (Bilateral); DDD (degenerative disc disease), lumbar; Grade 1 Anterolisthesis of lumbar spine (L4/L5); Grade 1 Retrolisthesis of L3/L4; Greater trochanteric bursitis of hip (Right); Lumbosacral facet joint syndrome (Bilateral); Lumbar facet arthropathy (Multilevel) (Bilateral); Lumbar facet joint pain; Osteoarthritis of facet joint of lumbar spine; Spondylosis without myelopathy or radiculopathy, lumbosacral region; Discogenic low back pain;  Chronic hip pain (Right); Chronic sacroiliac joint pain (Right); History of total hip replacement (Left); Burning pain (feet); Fixation hardware in spine; Chronic low back pain (Bilateral) (R>L) w/ sciatica (Bilateral); and Epidural fibrosis (Right) on their pertinent problem list. Pain Assessment: Severity of   is reported as a  /10. Location:    / . Onset:  . Quality:  . Timing:  . Modifying factor(s):  Marland Kitchen Vitals:  vitals were not taken for this visit.  BMI: Estimated body mass index is 28.25 kg/m as calculated from the following:   Height as of 06/26/22: 6\' 2"  (1.88 m).   Weight as of 06/26/22: 220 lb (99.8 kg). Last encounter: 04/03/2022. Last procedure: 06/26/2022.  Reason for encounter: post-procedure evaluation and assessment. ***  Post-procedure evaluation   Procedure: Caudal Epidural Steroid Injection + Epidurogram   #1  Laterality: Midline aiming right Level: Sacrococcygeal ligament  Imaging: Fluoroscopy-guided         Anesthesia: Local anesthesia (1-2% Lidocaine) Anxiolysis: None                 Sedation: No Sedation                       DOS: 06/26/2022  Performed by: Oswaldo Done, MD  Purpose: Diagnostic/Therapeutic Indications: Low back and lower extremity pain severe enough to impact quality of life or function. Rationale (medical necessity): procedure needed and proper for the diagnosis and/or treatment of Jonathan Stephens's medical symptoms and needs. 1. 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 Chronic lower extremity pain (2ry area of Pain) (Bilateral) (L>R)   3. Failed back surgical syndrome   4. DDD (degenerative disc disease), lumbar   5. Decreased range of motion of lumbar spine   6. Grade 1 Anterolisthesis of lumbar spine (L4/L5)  7. Grade 1 Retrolisthesis of L3/L4   8. Intervertebral disc disorder with radiculopathy of lumbar region    NAS-11 Pain score:   Pre-procedure: 7 /10   Post-procedure: 7 /10      Effectiveness:  Initial  hour after procedure:   ***. Subsequent 4-6 hours post-procedure:   ***. Analgesia past initial 6 hours:   ***. Ongoing improvement:  Analgesic:  *** Function:    ***    ROM:    ***     Pharmacotherapy Assessment  Analgesic: No chronic opioid analgesics therapy prescribed by our practice. Oxycodone/APAP 5/325, 1 tab p.o. twice daily. (09/11/2021) UDS (+) Carboxy-THC. MME/day: 15 mg/day   Monitoring: [place] PMP: PDMP reviewed during this encounter.       Pharmacotherapy: No side-effects or adverse reactions reported. Compliance: No problems identified. Effectiveness: Clinically acceptable.  No notes on file  No results found for: "CBDTHCR" No results found for: "D8THCCBX" No results found for: "D9THCCBX"  UDS:  Summary  Date Value Ref Range Status  09/11/2021 Note  Final    Comment:    ==================================================================== Compliance Drug Analysis, Ur ==================================================================== Test                             Result       Flag       Units  Drug Present and Declared for Prescription Verification   Noroxycodone                   869          EXPECTED   ng/mg creat   Noroxymorphone                 39           EXPECTED   ng/mg creat    Noroxycodone and noroxymorphone are expected metabolites of    oxycodone. Noroxymorphone is an expected metabolite of oxymorphone.    Sources of oxycodone and/or oxymorphone are scheduled prescription    medications.    Gabapentin                     PRESENT      EXPECTED   Mirtazapine                    PRESENT      EXPECTED   Acetaminophen                  PRESENT      EXPECTED  Drug Present not Declared for Prescription Verification   Carboxy-THC                    15           UNEXPECTED ng/mg creat    Carboxy-THC is a metabolite of tetrahydrocannabinol (THC). Source of    THC is most commonly herbal marijuana or marijuana-based products,    but THC is also present in  a scheduled prescription medication.    Trace amounts of THC can be present in hemp and cannabidiol (CBD)    products. This test is not intended to distinguish between delta-9-    tetrahydrocannabinol, the predominant form of THC in most herbal or    marijuana-based products, and delta-8-tetrahydrocannabinol.    Duloxetine                     PRESENT      UNEXPECTED  Trazodone                      PRESENT      UNEXPECTED   1,3 chlorophenyl piperazine    PRESENT      UNEXPECTED    1,3-chlorophenyl piperazine is an expected metabolite of trazodone.    Ibuprofen                      PRESENT      UNEXPECTED  Drug Absent but Declared for Prescription Verification   Oxycodone                      Not Detected UNEXPECTED ng/mg creat    Oxycodone is almost always present in patients taking this drug    consistently.  Absence of oxycodone could be due to lapse of time    since the last dose or unusual pharmacokinetics (rapid metabolism).  ==================================================================== Test                      Result    Flag   Units      Ref Range   Creatinine              164              mg/dL      >=16 ==================================================================== Declared Medications:  The flagging and interpretation on this report are based on the  following declared medications.  Unexpected results may arise from  inaccuracies in the declared medications.   **Note: The testing scope of this panel includes these medications:   Gabapentin (Neurontin)  Mirtazapine (Remeron)  Oxycodone (Percocet)   **Note: The testing scope of this panel does not include small to  moderate amounts of these reported medications:   Acetaminophen (Percocet)   **Note: The testing scope of this panel does not include the  following reported medications:   Amlodipine (Norvasc)  Atorvastatin (Lipitor)  Fish Oil  Iron  Multivitamin  Simvastatin (Zocor)  Vitamin  E ==================================================================== For clinical consultation, please call [phone]. ====================================================================       ROS  Constitutional: Denies any fever or chills Gastrointestinal: No reported hemesis, hematochezia, vomiting, or acute GI distress Musculoskeletal: Denies any acute onset joint swelling, redness, loss of ROM, or weakness Neurological: No reported episodes of acute onset apraxia, aphasia, dysarthria, agnosia, amnesia, paralysis, loss of coordination, or loss of consciousness  Medication Review  DULoxetine, EPINEPHrine, Fish Oil, Potassium Chloride ER, amLODipine, atorvastatin, ferrous sulfate, fluticasone, gabapentin, ibuprofen, multivitamin with minerals, naloxone, oxyCODONE-acetaminophen, sertraline, traZODone, and vitamin E  History Review  Allergy: Jonathan Stephens is allergic to bee venom. Drug: Jonathan Stephens  reports no history of drug use. Alcohol:  reports that he does not currently use alcohol. Tobacco:  reports that he has been smoking e-cigarettes. He has never used smokeless tobacco. Social: Jonathan Stephens  reports that he has been smoking e-cigarettes. He has never used smokeless tobacco. He reports that he does not currently use alcohol. He reports that he does not use drugs. Medical:  has a past medical history of Hepatitis. Surgical: Jonathan Stephens  has a past surgical history that includes Mohs surgery (03/2018); Total hip arthroplasty (Left, 09/30/2018); Tonsillectomy; Anterior lat lumbar fusion (Left, 12/03/2019); and Lumbar percutaneous pedicle screw 3 level (N/A, 12/03/2019). Family: family history is not on file.  Laboratory Chemistry Profile   Renal Lab Results  Component Value Date   BUN 7 (L)  09/11/2021   CREATININE 0.89 09/11/2021   BCR 8 (L) 09/11/2021   GFRAA >60 09/09/2019   GFRNONAA >60 12/01/2019    Hepatic Lab Results  Component Value Date   AST 15 09/11/2021   ALT 19  12/01/2019   ALBUMIN 4.6 09/11/2021   ALKPHOS 130 (H) 09/11/2021    Electrolytes Lab Results  Component Value Date   NA 143 09/11/2021   K 2.9 (L) 09/11/2021   CL 97 09/11/2021   CALCIUM 9.6 09/11/2021   MG 1.9 09/11/2021    Bone Lab Results  Component Value Date   25OHVITD1 59 09/11/2021   25OHVITD2 <1.0 09/11/2021   25OHVITD3 59 09/11/2021    Inflammation (CRP: Acute Phase) (ESR: Chronic Phase) Lab Results  Component Value Date   CRP 9 09/11/2021   ESRSEDRATE 32 (H) 09/11/2021         Note: Above Lab results reviewed.  Recent Imaging Review  DG PAIN CLINIC C-ARM 1-60 MIN NO REPORT Fluoro was used, but no Radiologist interpretation will be provided.  Please refer to "NOTES" tab for provider progress note. Note: Reviewed        Physical Exam  General appearance: Well nourished, well developed, and well hydrated. In no apparent acute distress Mental status: Alert, oriented x 3 (person, place, & time)       Respiratory: No evidence of acute respiratory distress Eyes: PERLA Vitals: There were no vitals taken for this visit. BMI: Estimated body mass index is 28.25 kg/m as calculated from the following:   Height as of 06/26/22: 6\' 2"  (1.88 m).   Weight as of 06/26/22: 220 lb (99.8 kg). Ideal: Patient weight not recorded  Assessment   Diagnosis Status  No diagnosis found. Controlled Controlled Controlled   Updated Problems: No problems updated.  Plan of Care  Problem-specific:  No problem-specific Assessment & Plan notes found for this encounter.  Jonathan Stephens has a current medication list which includes the following long-term medication(s): amlodipine, atorvastatin, duloxetine, ferrous sulfate, fluticasone, gabapentin, potassium chloride er, sertraline, trazodone, and trazodone.  Pharmacotherapy (Medications Ordered): No orders of the defined types were placed in this encounter.  Orders:  No orders of the defined types were placed in this  encounter.  Follow-up plan:   No follow-ups on file.      Interventional Therapies  Risk Factors  Considerations:       Extensive Lumbar HARDWARE   Planned  Pending:   Diagnostic/therapeutic right caudal ESI #1 + epidurogram    Under consideration:   Diagnostic/therapeutic right caudal ESI #1 + epidurogram  Therapeutic right RACZ procedure #1  Diagnostic/therapeutic right greater trochanteric bursa injection #1    Completed:   Therapeutic left (L5-S1) lumbar facet RFA x1 (02/20/2022)  Therapeutic right (L5-S1) lumbar facet RFA x1 (02/08/2022) (2wk FU:90/90/30/30)  Diagnostic/therapeutic right greater trochanteric bursa inj. x1 (12/12/2021) (100/100/100/100)  Diagnostic bilateral lumbar facet MBB (L2-S1) x1 (12/12/2021) (8 to 0/10) (100/100/100 x 3 days/LBP:0 LEP:100)  Diagnostic bilateral lumbar facet MBB (L4-S1) x2 (01/11/2022) (7 to R:2/10 L:0/10) (100/100/50/0)    Completed by other providers:   Diagnostic/therapeutic left L2-3 TFESI x1 (03/04/2017) by Holland Commons, MD [facility])  Diagnostic/therapeutic left IA hip injection x2 (04/12/2017, 05/08/2017) by Holland Commons, MD ([facility])  Diagnostic/therapeutic left L4-5 and L5-S1 TFESI x3 (05/29/2017, 09/20/2017, 12/12/2018) by Holland Commons, MD ([facility])  Diagnostic/therapeutic right L4-5 and L5-S1 TESI x1 (06/26/2019)  by Holland Commons, MD [facility])  Therapeutic  left total hip replacement (THR) x1 (09/30/2018) by P.  Dalldorf, MD  Therapeutic left L2-3, L3-4, L4-5 anterior lateral lumbar interbody fusion w/ percutaneous pedicle screw fixation (left) (12/03/2019) by Dr. Maeola Harman     Therapeutic  Palliative (PRN) options:   None established   Pharmacotherapy  No chronic opioid analgesics therapy prescribed by our practice. UDS (09/11/2021) (+) Carboxy-THC         Recent Visits Date Type Provider Dept  06/26/22 Procedure visit Delano Metz, MD [facility]  Showing recent visits within past 90 days and meeting all other requirements Future Appointments Date Type Provider Dept  07/24/22 Appointment Delano Metz, MD [facility]  Showing future appointments within next 90 days and meeting all other requirements  I discussed the assessment and treatment plan with the patient. The patient was provided an opportunity to ask questions and all were answered. The patient agreed with the plan and demonstrated an understanding of the instructions.  Patient advised to call back or seek an in-person evaluation if the symptoms or condition worsens.  Duration of encounter: *** minutes.  Total time on encounter, as per AMA guidelines included both the face-to-face and non-face-to-face time personally spent by the physician and/or other qualified health care professional(s) on the day of the encounter (includes time in activities that require the physician or other qualified health care professional and does not include time in activities normally performed by clinical staff). Physician's time may include the following activities when performed: Preparing to see the patient (e.g., pre-charting review of records, searching for previously ordered imaging, lab work, and nerve conduction tests) Review of prior analgesic pharmacotherapies. Reviewing PMP Interpreting ordered tests (e.g., lab work, imaging, nerve conduction tests) Performing post-procedure evaluations, including interpretation of diagnostic procedures Obtaining and/or reviewing separately obtained history Performing a medically appropriate examination and/or evaluation Counseling and educating the patient/family/caregiver Ordering medications, tests, or procedures Referring and communicating with other health care professionals (when not separately reported) Documenting clinical information in the electronic or other health record Independently interpreting results (not  separately reported) and communicating results to the patient/ family/caregiver Care coordination (not separately reported)  Note by: Oswaldo Done, MD Date: 07/24/2022; Time: 4:32 PM

## 2022-07-24 ENCOUNTER — Ambulatory Visit: Payer: Federal, State, Local not specified - PPO | Attending: Pain Medicine | Admitting: Pain Medicine

## 2022-07-24 ENCOUNTER — Encounter: Payer: Self-pay | Admitting: Pain Medicine

## 2022-07-24 VITALS — BP 110/70 | HR 79 | Resp 18 | Ht 74.0 in | Wt 220.0 lb

## 2022-07-24 DIAGNOSIS — M4316 Spondylolisthesis, lumbar region: Secondary | ICD-10-CM

## 2022-07-24 DIAGNOSIS — M79604 Pain in right leg: Secondary | ICD-10-CM | POA: Insufficient documentation

## 2022-07-24 DIAGNOSIS — M5386 Other specified dorsopathies, lumbar region: Secondary | ICD-10-CM | POA: Diagnosis present

## 2022-07-24 DIAGNOSIS — M5442 Lumbago with sciatica, left side: Secondary | ICD-10-CM | POA: Insufficient documentation

## 2022-07-24 DIAGNOSIS — M961 Postlaminectomy syndrome, not elsewhere classified: Secondary | ICD-10-CM

## 2022-07-24 DIAGNOSIS — M5459 Other low back pain: Secondary | ICD-10-CM | POA: Diagnosis present

## 2022-07-24 DIAGNOSIS — M4310 Spondylolisthesis, site unspecified: Secondary | ICD-10-CM

## 2022-07-24 DIAGNOSIS — M5441 Lumbago with sciatica, right side: Secondary | ICD-10-CM | POA: Insufficient documentation

## 2022-07-24 DIAGNOSIS — M5136 Other intervertebral disc degeneration, lumbar region: Secondary | ICD-10-CM | POA: Diagnosis present

## 2022-07-24 DIAGNOSIS — M79605 Pain in left leg: Secondary | ICD-10-CM | POA: Diagnosis present

## 2022-07-24 DIAGNOSIS — G8929 Other chronic pain: Secondary | ICD-10-CM | POA: Diagnosis present

## 2022-07-24 DIAGNOSIS — Z09 Encounter for follow-up examination after completed treatment for conditions other than malignant neoplasm: Secondary | ICD-10-CM | POA: Diagnosis present

## 2022-07-24 DIAGNOSIS — M51369 Other intervertebral disc degeneration, lumbar region without mention of lumbar back pain or lower extremity pain: Secondary | ICD-10-CM

## 2022-07-24 DIAGNOSIS — M5116 Intervertebral disc disorders with radiculopathy, lumbar region: Secondary | ICD-10-CM | POA: Diagnosis present

## 2022-07-24 NOTE — Patient Instructions (Signed)
